# Patient Record
Sex: Male | Born: 1955 | Race: Black or African American | Hispanic: No | Smoking: Current every day smoker
Health system: Southern US, Community
[De-identification: ages and names within clinical notes are randomized; demographics above are authoritative.]

## PROBLEM LIST (undated history)

## (undated) DIAGNOSIS — G8929 Other chronic pain: Secondary | ICD-10-CM

## (undated) DIAGNOSIS — M1990 Unspecified osteoarthritis, unspecified site: Secondary | ICD-10-CM

## (undated) DIAGNOSIS — Z86718 Personal history of other venous thrombosis and embolism: Secondary | ICD-10-CM

## (undated) DIAGNOSIS — M25519 Pain in unspecified shoulder: Secondary | ICD-10-CM

## (undated) DIAGNOSIS — D649 Anemia, unspecified: Secondary | ICD-10-CM

## (undated) DIAGNOSIS — M25559 Pain in unspecified hip: Secondary | ICD-10-CM

## (undated) DIAGNOSIS — I82409 Acute embolism and thrombosis of unspecified deep veins of unspecified lower extremity: Secondary | ICD-10-CM

## (undated) DIAGNOSIS — M5010 Cervical disc disorder with radiculopathy, unspecified cervical region: Secondary | ICD-10-CM

## (undated) DIAGNOSIS — I10 Essential (primary) hypertension: Secondary | ICD-10-CM

## (undated) DIAGNOSIS — B182 Chronic viral hepatitis C: Secondary | ICD-10-CM

## (undated) HISTORY — PX: NECK SURGERY: SHX720

## (undated) HISTORY — PX: HIP SURGERY: SHX245

---

## 2006-01-10 ENCOUNTER — Emergency Department (HOSPITAL_COMMUNITY): Admission: EM | Admit: 2006-01-10 | Discharge: 2006-01-10 | Payer: Self-pay | Admitting: Emergency Medicine

## 2007-11-04 ENCOUNTER — Emergency Department (HOSPITAL_COMMUNITY): Admission: EM | Admit: 2007-11-04 | Discharge: 2007-11-04 | Payer: Self-pay | Admitting: Emergency Medicine

## 2008-07-10 ENCOUNTER — Encounter (HOSPITAL_COMMUNITY): Admission: RE | Admit: 2008-07-10 | Discharge: 2008-08-03 | Payer: Self-pay

## 2008-10-29 ENCOUNTER — Ambulatory Visit (HOSPITAL_COMMUNITY): Admission: RE | Admit: 2008-10-29 | Discharge: 2008-10-29 | Payer: Self-pay | Admitting: Hematology and Oncology

## 2009-08-06 ENCOUNTER — Emergency Department (HOSPITAL_COMMUNITY): Admission: EM | Admit: 2009-08-06 | Discharge: 2009-08-06 | Payer: Self-pay | Admitting: Emergency Medicine

## 2010-07-07 ENCOUNTER — Emergency Department (HOSPITAL_COMMUNITY)
Admission: EM | Admit: 2010-07-07 | Discharge: 2010-07-07 | Disposition: A | Discharge status: Home / Self Care | Payer: Medicare Other | Attending: Emergency Medicine | Admitting: Emergency Medicine

## 2010-07-07 ENCOUNTER — Emergency Department (HOSPITAL_COMMUNITY): Payer: Medicare Other

## 2010-07-07 DIAGNOSIS — M129 Arthropathy, unspecified: Secondary | ICD-10-CM | POA: Insufficient documentation

## 2010-07-07 DIAGNOSIS — L02419 Cutaneous abscess of limb, unspecified: Secondary | ICD-10-CM | POA: Insufficient documentation

## 2010-07-07 DIAGNOSIS — IMO0002 Reserved for concepts with insufficient information to code with codable children: Secondary | ICD-10-CM | POA: Insufficient documentation

## 2010-07-07 DIAGNOSIS — I10 Essential (primary) hypertension: Secondary | ICD-10-CM | POA: Insufficient documentation

## 2010-07-07 LAB — CBC
MCH: 34.4 pg — ABNORMAL HIGH (ref 26.0–34.0)
MCHC: 34.6 g/dL (ref 30.0–36.0)
MCV: 99.6 fL (ref 78.0–100.0)
Platelets: 217 10*3/uL (ref 150–400)
RDW: 12.2 % (ref 11.5–15.5)

## 2010-07-07 LAB — BASIC METABOLIC PANEL
CO2: 29 mEq/L (ref 19–32)
Calcium: 9.6 mg/dL (ref 8.4–10.5)
Creatinine, Ser: 0.9 mg/dL (ref 0.4–1.5)
GFR calc non Af Amer: >60 mL/min (ref >60)

## 2010-07-07 LAB — DIFFERENTIAL
Basophils Relative: 0 % (ref 0–1)
Eosinophils Absolute: 0.3 10*3/uL (ref 0.0–0.7)
Eosinophils Relative: 4 % (ref 0–5)
Lymphs Abs: 2.4 10*3/uL (ref 0.7–4.0)
Monocytes Absolute: 1 10*3/uL (ref 0.1–1.0)
Monocytes Relative: 13 % — ABNORMAL HIGH (ref 3–12)

## 2011-01-22 LAB — DIFFERENTIAL
Basophils Absolute: 0
Basophils Relative: 0
Monocytes Relative: 11
Neutro Abs: 8.4 — ABNORMAL HIGH
Neutrophils Relative %: 73

## 2011-01-22 LAB — BASIC METABOLIC PANEL
Calcium: 9.4
GFR calc non Af Amer: >60
Glucose, Bld: 101 — ABNORMAL HIGH
Sodium: 137

## 2011-01-22 LAB — POCT CARDIAC MARKERS
Myoglobin, poc: 67.5
Myoglobin, poc: 89.4
Operator id: 106841
Operator id: 208401
Troponin i, poc: 0.07 — ABNORMAL HIGH

## 2011-01-22 LAB — CK TOTAL AND CKMB (NOT AT ARMC): Total CK: 146

## 2011-01-22 LAB — CBC
MCHC: 33.8
RBC: 4.76

## 2012-03-21 ENCOUNTER — Ambulatory Visit (HOSPITAL_COMMUNITY)
Admission: RE | Admit: 2012-03-21 | Discharge: 2012-03-21 | Disposition: A | Discharge status: Home / Self Care | Payer: Non-veteran care | Source: Ambulatory Visit | Attending: Family Medicine | Admitting: Family Medicine

## 2012-03-21 DIAGNOSIS — IMO0001 Reserved for inherently not codable concepts without codable children: Secondary | ICD-10-CM | POA: Insufficient documentation

## 2012-03-21 DIAGNOSIS — M6281 Muscle weakness (generalized): Secondary | ICD-10-CM | POA: Insufficient documentation

## 2012-03-21 DIAGNOSIS — M25519 Pain in unspecified shoulder: Secondary | ICD-10-CM | POA: Insufficient documentation

## 2012-03-21 NOTE — Evaluation (Addendum)
Occupational Therapy Evaluation  Patient Details  Name: Jimmy Holland MRN: [id number] Date of Birth: Mar 10, 1956  Today's Date: 03/21/2012 Time: 4098-1191 OT Time Calculation (min): 30 min OT Evaluation 1015-1030 15' Manual Therapy 1030-1045 15'  Visit#: 1  of 8   Re-eval: 04/18/12  Assessment Diagnosis: Bilateral Shoulder Weakness  Authorization: [facility] has authorized 8 visits  Authorization Time Period:    Authorization Visit#: 1  of 8    Past Medical History: No past medical history on file. Past Surgical History: No past surgical history on file.  Subjective S:  can't hardly get my clothes on or shave. Pertinent History: Jimmy Holland has been having increased pain and loss of mobility in his shoulders, with the left being worse than the right, for the past 2 months.  He consulted with his MD at the [place], and has been referred to occupational therapy for evalution and treatment.  He has a C4-C6 fusion several years ago and has had difficulties with LUE use in the past.  He had an xray completed at the [place] which was nonconclusive, according to the patient.  He has been referred to occupational therapy for evaluation and treatment. Special Tests: UEFI scored 31% I level Patient Stated Goals: I want to get my arms working again.   Pain Assessment Currently in Pain?: Yes Pain Score:   5 Pain Location: Shoulder Pain Orientation: Right;Left Pain Type: Acute pain  Precautions/Restrictions   progress as tolerated  Prior Functioning  Home Living Lives With: Family Prior Function Level of Independence: Independent with basic ADLs;Independent with homemaking with ambulation Driving: Yes Vocation: Retired Leisure: Hobbies-yes (Comment) Comments: Enjoys yard work, basketball, Curator work  Assessment ADL/Vision/Perception ADL ADL Comments: Jimmy Holland is having difficulty donning and doffing shirts, reaching into cabinets for cans of food, shaving, taking a bath  Dominant Hand:  Right  Cognition/Observation Cognition Overall Cognitive Status: Appears within functional limits for tasks assessed  Sensation/Coordination/Edema Sensation Light Touch: Appears Intact Coordination Gross Motor Movements are Fluid and Coordinated: Yes Fine Motor Movements are Fluid and Coordinated: Yes  Additional Assessments RUE AROM (degrees) RUE Overall AROM Comments: Assessed in seated Right Shoulder Flexion: 126 Degrees Right Shoulder ABduction: 120 Degrees Right Shoulder Internal Rotation: 95 Degrees Right Shoulder External Rotation: 29 Degrees RUE PROM (degrees) RUE Overall PROM Comments: WFL in supine RUE Strength Right Shoulder Flexion:  (4-/5) Right Shoulder ABduction:  (4-/5) Right Shoulder Internal Rotation:  (4-/5) Right Shoulder External Rotation: 3+/5 Grip (lbs): 47 LUE AROM (degrees) LUE Overall AROM Comments: assessed in seated ER/IR with shoulder adducted Left Shoulder Flexion: 40 Degrees Left Shoulder ABduction: 38 Degrees Left Shoulder Internal Rotation: 90 Degrees Left Shoulder External Rotation: 30 Degrees LUE PROM (degrees) LUE Overall PROM Comments: WFL in supine LUE Strength Left Shoulder Flexion: 3-/5 Left Shoulder ABduction: 3-/5 Left Shoulder Internal Rotation: 4/5 Left Shoulder External Rotation: 3-/5 Grip (lbs): 25 Palpation Palpation: Left shoulder with moderate fascial restrictions in scapular region     Exercise/Treatments    Manual Therapy Manual Therapy: Myofascial release Myofascial Release: MFR and manual stretching to bilateral scapular, upper arm, and shoulder region to decrease pain and fascial restrictions and increase pain free mobility.  [id number]  Occupational Therapy Assessment and Plan OT Assessment and Plan Clinical Impression Statement: A:  Jimmy Holland is a 56 year old male with bilateral shoulder weakness, grip strength, and decreased mobiity, with increased pain and fascial restrictions causing decreased functional I  with B/IADLs, leisure activities.  Pt will benefit from skilled therapeutic  intervention in order to improve on the following deficits: Decreased range of motion;Decreased strength;Increased muscle spasms;Increased fascial restricitons;Impaired UE functional use;Pain Rehab Potential: Good OT Frequency: Min 2X/week OT Duration: 4 weeks OT Treatment/Interventions: Self-care/ADL training;Therapeutic exercise;Therapeutic activities;Manual therapy;Patient/family education OT Plan: P:  Skilled OT intervention to decrease pain and restrictions and increase pain free mobility, strength, grip strength, and functional use of BUE to [facility].  Treatment Plan:  MFR and manual stretching to bilateral shoulders, supine PROM and AAROM, isometric stretgthening seated elev, ext, ret, row, ball stretches, pulleys, grip strengthening with yellow tputty and sponge activity.   Goals Short Term Goals Time to Complete Short Term Goals: 2 weeks Short Term Goal 1: Patient will be educated on HEP.  Short Term Goal 2: Patient will increase bilateral shoulder AROM by 10 degrees in order to don and doff shirts with greater independence. Short Term Goal 3: Patient will increase bilateral shoulder strength to 4/5 for increased ability to lift canned goods into cabinets. Short Term Goal 4: Patient will increase bilateral grip strength by 5 pounds for increased I with opening jars when cooking. Short Term Goal 5: Patient will decrease pain in bilateral shoulders to 3/10 when completing functional activities. Additional Short Term Goals?: Yes Short Term Goal 6: Patient will increase scapular stability to fair +. Short Term Goal 7: Patient will decrease fascial restrictions to minimal in his shoulders. Long Term Goals Time to Complete Long Term Goals: 4 weeks Long Term Goal 1: Patient will increase I with B/IADLs and leisure activities to prior level of I. Long Term Goal 2: Patient will increase bilateral shoulder AROM by 30 degrees  in order to don and doff shirts with greater independence. Long Term Goal 3: Patient will increase bilateral shoulder strength to 4+/5 for increased ability to lift canned goods into cabinets. Long Term Goal 4: Patient will increase bilateral grip strength by 15 pounds for increased I with opening jars when cooking. Long Term Goal 5: Patient will decrease pain in bilateral shoulders to 1/10 when completing functional activities. Additional Long Term Goals?: Yes Long Term Goal 6: Patient will increase scapular stability to good -. Long Term Goal 7: Patient will decrease fascial restrictions to minimal in his shoulders.  Problem List Patient Active Problem List  Diagnosis  . Pain in joint, shoulder region  . Muscle weakness (generalized)    End of Session Activity Tolerance: Patient tolerated treatment well General Behavior During Session: [facility] for tasks performed Cognition: [facility] for tasks performed OT Plan of Care OT Home Exercise Plan: Educated on grip strengthening with theraputty and dowel rod exercises in supine.  Shirlean Mylar, OTR/L  03/21/2012, 3:17 PM  Physician Documentation Your signature is required to indicate approval of the treatment plan as stated above.  Please sign and either send electronically or make a copy of this report for your files and return this physician signed original.  Please mark one 1.__approve of plan  2. ___approve of plan with the following conditions.   ______________________________                                                          _____________________ Physician Signature  Date Gcodes: Moving handling items Current [id number] ck Goal      [phone] ci

## 2012-03-28 ENCOUNTER — Ambulatory Visit (HOSPITAL_COMMUNITY)
Admission: RE | Admit: 2012-03-28 | Discharge: 2012-03-28 | Disposition: A | Discharge status: Home / Self Care | Payer: Non-veteran care | Source: Ambulatory Visit | Attending: Family Medicine | Admitting: Family Medicine

## 2012-03-28 DIAGNOSIS — M25519 Pain in unspecified shoulder: Secondary | ICD-10-CM | POA: Insufficient documentation

## 2012-03-28 DIAGNOSIS — M6281 Muscle weakness (generalized): Secondary | ICD-10-CM | POA: Insufficient documentation

## 2012-03-28 DIAGNOSIS — IMO0001 Reserved for inherently not codable concepts without codable children: Secondary | ICD-10-CM | POA: Insufficient documentation

## 2012-03-28 NOTE — Progress Notes (Signed)
Occupational Therapy Treatment Patient Details  Name: Jimmy Holland MRN: [id number] Date of Birth: 1956/01/09  Today's Date: 03/28/2012 Time: 4098-1191 OT Time Calculation (min): 40 min Manual Therapy [phone] 25' Therapeutic Exercise 1002-1016 14'  Visit#: 2  of 8   Re-eval: 04/18/12 Assessment Diagnosis: Bilateral Shoulder Weakness  Authorization: [facility] has authorized 8 visits   Authorization Time Period:    Authorization Visit#: 2  of 8   Subjective Symptoms/Limitations Symptoms: S:  My arms are stiff this morning. Pain Assessment Currently in Pain?: No/denies  Precautions/Restrictions     Exercise/Treatments Supine Protraction: PROM;10 reps;AAROM;5 reps Protraction Limitations: assist needed to initiate and eccentric contraction to prevent pain. Horizontal ABduction: PROM;10 reps;AAROM;5 reps External Rotation: PROM;10 reps;AAROM;5 reps Internal Rotation: PROM;10 reps;AAROM;5 reps Flexion: PROM;10 reps;AAROM;5 reps Flexion Limitations: assist needed to initiate and eccentric contraction to prevent pain. ABduction: PROM;10 reps;AROM;5 reps Seated Elevation: AROM;10 reps Extension: AROM;10 reps Row: AROM;10 reps Therapy Ball Flexion: 15 reps ABduction: 15 reps;Other (comment) (left then right) ROM / Strengthening / Isometric Strengthening   Flexion: 5X5" Extension: 5X5" External Rotation: 5X5" Internal Rotation: 5X5" ABduction: 5X5" ADduction: 5X5"     Manual Therapy Manual Therapy: Myofascial release Myofascial Release: MFR and manual stretching to bilateral scapular, upper arm, and shoulder region to decrease pain and fascial restrictions and increase pain free mobility  Occupational Therapy Assessment and Plan OT Assessment and Plan Clinical Impression Statement: A:  Added multiple new exercises which patient tolerated well, isometrics, AAROM with dowel and assist from therapist, ball stretches and scapular AROM.  Assist needed  with AAROM to initiate and  for eccentric contraction to prevent pain. OT Plan: P:  Add pulleys and grip strengthening to increase bilateral shoulder AROM and grip strength.   Goals Short Term Goals Short Term Goal 1: Patient will be educated on HEP.  Short Term Goal 2: Patient will increase bilateral shoulder AROM by 10 degrees in order to don and doff shirts with greater independence. Short Term Goal 3: Patient will increase bilateral shoulder strength to 4/5 for increased ability to lift canned goods into cabinets. Short Term Goal 4: Patient will increase bilateral grip strength by 5 pounds for increased I with opening jars when cooking. Short Term Goal 5: Patient will decrease pain in bilateral shoulders to 3/10 when completing functional activities. Additional Short Term Goals?: Yes Short Term Goal 6: Patient will increase scapular stability to fair +. Short Term Goal 7: Patient will decrease fascial restrictions to minimal in his shoulders. Long Term Goals Time to Complete Long Term Goals: 4 weeks Long Term Goal 1: Patient will increase I with B/IADLs and leisure activities to prior level of I. Long Term Goal 2: Patient will increase bilateral shoulder AROM by 30 degrees in order to don and doff shirts with greater independence. Long Term Goal 3: Patient will increase bilateral shoulder strength to 4+/5 for increased ability to lift canned goods into cabinets. Long Term Goal 4: Patient will increase bilateral grip strength by 15 pounds for increased I with opening jars when cooking. Long Term Goal 5: Patient will decrease pain in bilateral shoulders to 1/10 when completing functional activities. Additional Long Term Goals?: Yes Long Term Goal 6: Patient will increase scapular stability to good -. Long Term Goal 7: Patient will decrease fascial restrictions to minimal in his shoulders.  Problem List Patient Active Problem List  Diagnosis  . Pain in joint, shoulder region  . Muscle weakness (generalized)     End of Session Activity Tolerance:  Patient tolerated treatment well General Behavior During Session: [facility] for tasks performed Cognition: [facility] for tasks performed  GO   Gervase Colberg L. Xzavien Harada, COTA/L  03/28/2012, 10:22 AM

## 2012-03-30 ENCOUNTER — Inpatient Hospital Stay (HOSPITAL_COMMUNITY): Admission: RE | Admit: 2012-03-30 | Payer: Non-veteran care | Source: Ambulatory Visit | Admitting: Occupational Therapy

## 2012-04-04 ENCOUNTER — Ambulatory Visit (HOSPITAL_COMMUNITY): Payer: Non-veteran care | Admitting: Specialist

## 2012-04-06 ENCOUNTER — Ambulatory Visit (HOSPITAL_COMMUNITY): Payer: Non-veteran care | Admitting: Occupational Therapy

## 2012-04-11 ENCOUNTER — Ambulatory Visit (HOSPITAL_COMMUNITY)
Admission: RE | Admit: 2012-04-11 | Discharge: 2012-04-11 | Disposition: A | Discharge status: Home / Self Care | Payer: Non-veteran care | Source: Ambulatory Visit | Attending: Family Medicine | Admitting: Family Medicine

## 2012-04-11 DIAGNOSIS — M25519 Pain in unspecified shoulder: Secondary | ICD-10-CM

## 2012-04-11 DIAGNOSIS — M6281 Muscle weakness (generalized): Secondary | ICD-10-CM

## 2012-04-11 NOTE — Progress Notes (Signed)
Occupational Therapy Treatment Patient Details  Name: Jimmy Holland MRN: [id number] Date of Birth: [birth year]  Today's Date: 04/11/2012 Time: 3086-5784 OT Time Calculation (min): 49 min Manual Therapy [id number] 22' Therapeutic Exercises [phone] 27' Visit#: 3  of 8   Re-eval: 04/18/12    Authorization: [facility] has authorized 8 visits   Authorization Time Period:    Authorization Visit#: 3  of 8   Subjective S:  I went to the [place].  I go back in January.  They think I am going to need shoulder surgery, it may be coming from my neck.  Pain Assessment Currently in Pain?: Yes Pain Score:   8 Pain Location: Shoulder Pain Orientation: Right;Left Pain Type: Chronic pain  Precautions/Restrictions   progress as tolerated  Exercise/Treatments Supine Protraction: PROM;5 reps;AAROM;10 reps;Limitations Protraction Limitations: assist needed to initiate and eccentric contraction to prevent pain. Horizontal ABduction: PROM;5 reps;AAROM;10 reps;Limitations Horizontal ABduction Limitations: with assist from OTR/L External Rotation: PROM;5 reps;AAROM;10 reps;Limitations (with assist from OTR/L) External Rotation Limitations: with assist from OTR/L Internal Rotation: PROM;5 reps;AAROM;Limitations Internal Rotation Limitations: with assist from OTR/L Flexion: PROM;5 reps;AAROM;10 reps;Limitations Flexion Limitations: with assist from OTR/L  ABduction: PROM;10 reps Seated Elevation: AROM;15 reps Extension: AROM;15 reps Row: AROM;15 reps Therapy Ball Flexion: 25 reps ABduction: 25 reps (to right and left) ROM / Strengthening / Isometric Strengthening   Flexion: 5X5" (seated) Extension: 5X5" (seated) External Rotation: 5X5" (seated) Internal Rotation: 5X5" (seated) ABduction: 5X5" (seated) ADduction: 5X5" (seated)    Manual Therapy Manual Therapy: Myofascial release Myofascial Release: MFR and manual stretching to bilateral scapular, upper arm, and shoulder region to decrease pain and  fascial restrictions and increase pain free mobility   Occupational Therapy Assessment and Plan OT Assessment and Plan Clinical Impression Statement: A:  Requires assistance with dowel rod exercises in supine due to limited mobility.  Transitioned to isometrics in seated.  Patient has good results with these exercises so issued as HEP, in addition to towel slides, dc dowel rod exercises from HEP at this time.  Pain level decreased from 8/10 to 7/10 with MFR.   OT Plan: P:  Add pulleys and grip strengthening exercises, followup on HEP.   Goals Short Term Goals Short Term Goal 1: Patient will be educated on HEP.  Short Term Goal 1 Progress: Progressing toward goal Short Term Goal 2: Patient will increase bilateral shoulder AROM by 10 degrees in order to don and doff shirts with greater independence. Short Term Goal 2 Progress: Progressing toward goal Short Term Goal 3: Patient will increase bilateral shoulder strength to 4/5 for increased ability to lift canned goods into cabinets. Short Term Goal 3 Progress: Progressing toward goal Short Term Goal 4: Patient will increase bilateral grip strength by 5 pounds for increased I with opening jars when cooking. Short Term Goal 4 Progress: Progressing toward goal Short Term Goal 5: Patient will decrease pain in bilateral shoulders to 3/10 when completing functional activities. Short Term Goal 5 Progress: Progressing toward goal Additional Short Term Goals?: Yes Short Term Goal 6: Patient will increase scapular stability to fair +. Short Term Goal 6 Progress: Progressing toward goal Short Term Goal 7: Patient will decrease fascial restrictions to minimal in his shoulders. Short Term Goal 7 Progress: Progressing toward goal Long Term Goals Time to Complete Long Term Goals: 4 weeks Long Term Goal 1: Patient will increase I with B/IADLs and leisure activities to prior level of I. Long Term Goal 1 Progress: Progressing toward goal Long Term Goal 2:  Patient will increase bilateral shoulder AROM by 30 degrees in order to don and doff shirts with greater independence. Long Term Goal 2 Progress: Progressing toward goal Long Term Goal 3: Patient will increase bilateral shoulder strength to 4+/5 for increased ability to lift canned goods into cabinets. Long Term Goal 3 Progress: Progressing toward goal Long Term Goal 4: Patient will increase bilateral grip strength by 15 pounds for increased I with opening jars when cooking. Long Term Goal 4 Progress: Progressing toward goal Long Term Goal 5: Patient will decrease pain in bilateral shoulders to 1/10 when completing functional activities. Long Term Goal 5 Progress: Progressing toward goal Additional Long Term Goals?: Yes Long Term Goal 6: Patient will increase scapular stability to good -. Long Term Goal 6 Progress: Progressing toward goal Long Term Goal 7: Patient will decrease fascial restrictions to minimal in his shoulders. Long Term Goal 7 Progress: Progressing toward goal  Problem List Patient Active Problem List  Diagnosis  . Pain in joint, shoulder region  . Muscle weakness (generalized)    End of Session Activity Tolerance: Patient tolerated treatment well General Behavior During Session: [facility] for tasks performed Cognition: [facility] for tasks performed OT Plan of Care OT Home Exercise Plan: dc dowel rod exercises, added towel slides and isometrics.   Shirlean Mylar, OTR/L  04/11/2012, 11:20 AM

## 2012-04-13 ENCOUNTER — Ambulatory Visit (HOSPITAL_COMMUNITY)
Admission: RE | Admit: 2012-04-13 | Discharge: 2012-04-13 | Disposition: A | Discharge status: Home / Self Care | Payer: Non-veteran care | Source: Ambulatory Visit | Attending: Family Medicine | Admitting: Family Medicine

## 2012-04-13 DIAGNOSIS — M25519 Pain in unspecified shoulder: Secondary | ICD-10-CM

## 2012-04-13 DIAGNOSIS — M6281 Muscle weakness (generalized): Secondary | ICD-10-CM

## 2012-04-13 NOTE — Progress Notes (Signed)
Occupational Therapy Treatment Patient Details  Name: Jimmy Holland MRN: [id number] Date of Birth: [birth year]  Today's Date: 04/13/2012 Time: 1020-1110 OT Time Calculation (min): 50 min Manual Therapy 1020-1053 33' Therapeutic Exercise 1054-1110 16'  Visit#: 4  of 8   Re-eval: 04/18/12    Authorization: [facility] has authorized 8 visits   Authorization Time Period:    Authorization Visit#: 4  of 8   Subjective Symptoms/Limitations Symptoms: S:  I am aching a bit today, mostly around my neck. Pain Assessment Currently in Pain?: Yes Pain Score:   8 Pain Location: Shoulder (shoulder into neck) Pain Orientation: Right;Left Pain Type: Acute pain  Precautions/Restrictions     Exercise/Treatments Supine Protraction: PROM;5 reps;AAROM;10 reps;Limitations Horizontal ABduction: PROM;5 reps;AAROM;10 reps;Limitations External Rotation: PROM;5 reps;AAROM;10 reps;Limitations Internal Rotation: PROM;5 reps;AAROM;Limitations Flexion: PROM;5 reps;AAROM;10 reps;Limitations ABduction: PROM;10 reps Seated Elevation: AROM;15 reps Extension: AROM;15 reps Row: AROM;15 reps Pulleys Flexion: 2 minutes ABduction: 2 minutes Therapy Ball Flexion: 25 reps ABduction: 25 reps (to right and left) ROM / Strengthening / Isometric Strengthening   Flexion: 5X5" (seated) Extension: 5X5" (seated) External Rotation: 5X5" (seated) Internal Rotation: 5X5" (seated) ABduction: 5X5" (seated) ADduction: 5X5" (seated)       Manual Therapy Manual Therapy: Myofascial release Myofascial Release: MFR and manual stretching to bilateral scapular, upper arm, and shoulder region to decrease pain and fascial restrictions and increase pain free mobility  Occupational Therapy Assessment and Plan OT Assessment and Plan Clinical Impression Statement: A:  Added pulleys today which patient completed well.  Decreased assist needed for eccentric contraction with AAROM. OT Plan: P:  Add grip strengthening to address  hand weakness.   Goals Short Term Goals Short Term Goal 1: Patient will be educated on HEP.  Short Term Goal 2: Patient will increase bilateral shoulder AROM by 10 degrees in order to don and doff shirts with greater independence. Short Term Goal 3: Patient will increase bilateral shoulder strength to 4/5 for increased ability to lift canned goods into cabinets. Short Term Goal 4: Patient will increase bilateral grip strength by 5 pounds for increased I with opening jars when cooking. Short Term Goal 5: Patient will decrease pain in bilateral shoulders to 3/10 when completing functional activities. Additional Short Term Goals?: Yes Short Term Goal 6: Patient will increase scapular stability to fair +. Short Term Goal 7: Patient will decrease fascial restrictions to minimal in his shoulders. Long Term Goals Time to Complete Long Term Goals: 4 weeks Long Term Goal 1: Patient will increase I with B/IADLs and leisure activities to prior level of I. Long Term Goal 2: Patient will increase bilateral shoulder AROM by 30 degrees in order to don and doff shirts with greater independence. Long Term Goal 3: Patient will increase bilateral shoulder strength to 4+/5 for increased ability to lift canned goods into cabinets. Long Term Goal 4: Patient will increase bilateral grip strength by 15 pounds for increased I with opening jars when cooking. Long Term Goal 5: Patient will decrease pain in bilateral shoulders to 1/10 when completing functional activities. Additional Long Term Goals?: Yes Long Term Goal 6: Patient will increase scapular stability to good -. Long Term Goal 7: Patient will decrease fascial restrictions to minimal in his shoulders.  Problem List Patient Active Problem List  Diagnosis  . Pain in joint, shoulder region  . Muscle weakness (generalized)    End of Session Activity Tolerance: Patient tolerated treatment well General Behavior During Session: [facility] for tasks  performed Cognition: [facility] for tasks performed  GO    [name], Iyonnah Ferrante L 04/13/2012, 2:24 PM

## 2012-04-18 ENCOUNTER — Ambulatory Visit (HOSPITAL_COMMUNITY)
Admission: RE | Admit: 2012-04-18 | Discharge: 2012-04-18 | Disposition: A | Discharge status: Home / Self Care | Payer: Non-veteran care | Source: Ambulatory Visit | Attending: Family Medicine | Admitting: Family Medicine

## 2012-04-18 DIAGNOSIS — M6281 Muscle weakness (generalized): Secondary | ICD-10-CM

## 2012-04-18 DIAGNOSIS — M25519 Pain in unspecified shoulder: Secondary | ICD-10-CM

## 2012-04-18 NOTE — Progress Notes (Signed)
Occupational Therapy Treatment Patient Details  Name: Jimmy Holland MRN: [id number] Date of Birth: 1955-05-18  Today's Date: 04/18/2012 Time: 4098-1191 OT Time Calculation (min): 44 min Manual Therapy 1015-1046 31 Therapeutic Exercise 4782-9562 12'  Visit#: 5  of 8   Re-eval: 05/16/12 Assessment Diagnosis: Bilateral Shoulder Weakness  Authorization: [facility] has authorized [facility] visits  Authorization Time Period:    Authorization Visit#: 5  of 8   Subjective Symptoms/Limitations Symptoms: S:  I'm not hurting like I was.  Mostly just stiff. Special Tests: UEFI scored 31% I level at initial evaluation and is currently 46% I level.   Pain Assessment Currently in Pain?: Yes Pain Score:   4 Pain Orientation: Right;Left Pain Type: Acute pain  Precautions/Restrictions     Exercise/Treatments Supine Protraction: PROM;5 reps Horizontal ABduction: PROM;5 reps External Rotation: PROM;5 reps Internal Rotation: PROM;5 reps Flexion: PROM;5 reps ABduction: PROM;5 reps Seated Elevation: AROM;15 reps Extension: AROM;15 reps Row: AROM;15 reps Pulleys Flexion: Other (comment) (time secondary to reeval) ABduction: Other (comment) (time secondary to reeval) Therapy Ball Flexion: 25 reps ABduction: 25 reps ROM / Strengthening / Isometric Strengthening   Flexion: Other (comment) (time secondary to reeval) Extension: Other (comment) External Rotation: Other (comment) Internal Rotation: Other (comment) ABduction: Other (comment) ADduction: Other (comment)      Manual Therapy Manual Therapy: Myofascial release Myofascial Release: MFR and manual stretching to bilateral scapular, upper arm, and shoulder region to decrease pain and fascial restrictions and increase pain free mobility   Occupational Therapy Assessment and Plan OT Assessment and Plan Clinical Impression Statement: A:  See progress note.  Some exercises missed secondary to reeval. OT Plan: P:  Resume missed exercises and add  grip strengthening, if joints not inflamed, to address hand weakness.   Goals Short Term Goals Short Term Goal 1: Patient will be educated on HEP.  Short Term Goal 1 Progress: Progressing toward goal Short Term Goal 2: Patient will increase bilateral shoulder AROM by 10 degrees in order to don and doff shirts with greater independence. Short Term Goal 2 Progress: Progressing toward goal Short Term Goal 3: Patient will increase bilateral shoulder strength to 4/5 for increased ability to lift canned goods into cabinets. Short Term Goal 3 Progress: Progressing toward goal Short Term Goal 4: Patient will increase bilateral grip strength by 5 pounds for increased I with opening jars when cooking. Short Term Goal 4 Progress: Progressing toward goal Short Term Goal 5: Patient will decrease pain in bilateral shoulders to 3/10 when completing functional activities. Short Term Goal 5 Progress: Progressing toward goal Additional Short Term Goals?: Yes Short Term Goal 6: Patient will increase scapular stability to fair +. Short Term Goal 6 Progress: Progressing toward goal Short Term Goal 7: Patient will decrease fascial restrictions to minimal in his shoulders. Short Term Goal 7 Progress: Progressing toward goal Long Term Goals Time to Complete Long Term Goals: 4 weeks Long Term Goal 1: Patient will increase I with B/IADLs and leisure activities to prior level of I. Long Term Goal 1 Progress: Progressing toward goal Long Term Goal 2: Patient will increase bilateral shoulder AROM by 30 degrees in order to don and doff shirts with greater independence. Long Term Goal 2 Progress: Progressing toward goal Long Term Goal 3: Patient will increase bilateral shoulder strength to 4+/5 for increased ability to lift canned goods into cabinets. Long Term Goal 3 Progress: Progressing toward goal Long Term Goal 4: Patient will increase bilateral grip strength by 15 pounds for increased I with  opening jars when  cooking. Long Term Goal 4 Progress: Progressing toward goal Long Term Goal 5: Patient will decrease pain in bilateral shoulders to 1/10 when completing functional activities. Long Term Goal 5 Progress: Progressing toward goal Additional Long Term Goals?: Yes Long Term Goal 6: Patient will increase scapular stability to good -. Long Term Goal 6 Progress: Progressing toward goal Long Term Goal 7: Patient will decrease fascial restrictions to minimal in his shoulders. Long Term Goal 7 Progress: Progressing toward goal  Problem List Patient Active Problem List  Diagnosis  . Pain in joint, shoulder region  . Muscle weakness (generalized)    End of Session Activity Tolerance: Patient tolerated treatment well General Behavior During Session: [facility] for tasks performed Cognition: [facility] for tasks performed  GO    [name], Zaliah Wissner L 04/18/2012, 1:47 PM

## 2012-04-21 ENCOUNTER — Ambulatory Visit (HOSPITAL_COMMUNITY)
Admission: RE | Admit: 2012-04-21 | Payer: Non-veteran care | Source: Ambulatory Visit | Attending: Family Medicine | Admitting: Family Medicine

## 2012-04-25 ENCOUNTER — Ambulatory Visit (HOSPITAL_COMMUNITY)
Admission: RE | Admit: 2012-04-25 | Discharge: 2012-04-25 | Disposition: A | Discharge status: Home / Self Care | Payer: Non-veteran care | Source: Ambulatory Visit | Attending: Family Medicine | Admitting: Family Medicine

## 2012-04-25 DIAGNOSIS — M6281 Muscle weakness (generalized): Secondary | ICD-10-CM

## 2012-04-25 DIAGNOSIS — M25519 Pain in unspecified shoulder: Secondary | ICD-10-CM

## 2012-04-25 NOTE — Progress Notes (Signed)
Occupational Therapy Treatment Patient Details  Name: Jimmy Holland MRN: [id number] Date of Birth: January 22, 1956  Today's Date: 04/25/2012 Time: 8469-6295 OT Time Calculation (min): 38 min Manual Therapy 2841-3244 17' Therapeutic Exercises 1043-1106 23' Visit#: 6  of 8   Re-eval: 05/16/12    Authorization: [facility] has authorized 8 visits   Authorization Time Period:    Authorization Visit#: 6  of 8   Subjective S:  I still feel pretty stiff.  Hopefully when I go to the MD in January I get some answers.  The therapy is helping with the pain, just not the movement. Pain Assessment Currently in Pain?: Yes Pain Score:   6 Pain Location: Shoulder Pain Orientation: Right;Left Pain Type: Chronic pain  Precautions/Restrictions   progress as tolerated  Exercise/Treatments Supine Protraction: PROM;5 reps Horizontal ABduction: PROM;5 reps External Rotation: PROM;5 reps Internal Rotation: PROM;5 reps Flexion: PROM;5 reps ABduction: PROM;5 reps Seated Elevation: AROM;15 reps Extension: AROM;15 reps Row: AROM;15 reps Pulleys Flexion: 2 minutes ABduction: 2 minutes Therapy Ball Flexion: 25 reps ABduction: 25 reps ROM / Strengthening / Isometric Strengthening   Flexion: 5X5" (seated) Extension: 5X5" (seated) External Rotation: 5X5" (seated) Internal Rotation: 5X5" (seated) ABduction: 5X5" (seated) ADduction: 5X5" (seated)    Manual Therapy Manual Therapy: Myofascial release Myofascial Release: MFR and manual stretching to bilateral scapular, upper arm, and shoulder region to decrease pain and fascial restrictions and increase pain free mobility 1026-1043  Occupational Therapy Assessment and Plan OT Assessment and Plan Clinical Impression Statement: A:  Less restrictions and increased mobility in right shoulder this date, however left remains quite limited and with audible crepitis when ranging the shoulder.   OT Plan: P: Increase contraction time on isometric exercises. Decrease  pain to 4/10. Resume grip strengthening exercises.    Goals Short Term Goals Short Term Goal 1: Patient will be educated on HEP.  Short Term Goal 1 Progress: Progressing toward goal Short Term Goal 2: Patient will increase bilateral shoulder AROM by 10 degrees in order to don and doff shirts with greater independence. Short Term Goal 2 Progress: Progressing toward goal Short Term Goal 3: Patient will increase bilateral shoulder strength to 4/5 for increased ability to lift canned goods into cabinets. Short Term Goal 3 Progress: Progressing toward goal Short Term Goal 4: Patient will increase bilateral grip strength by 5 pounds for increased I with opening jars when cooking. Short Term Goal 4 Progress: Progressing toward goal Short Term Goal 5: Patient will decrease pain in bilateral shoulders to 3/10 when completing functional activities. Short Term Goal 5 Progress: Progressing toward goal Additional Short Term Goals?: Yes Short Term Goal 6: Patient will increase scapular stability to fair +. Short Term Goal 6 Progress: Progressing toward goal Short Term Goal 7: Patient will decrease fascial restrictions to minimal in his shoulders. Short Term Goal 7 Progress: Progressing toward goal Long Term Goals Time to Complete Long Term Goals: 4 weeks Long Term Goal 1: Patient will increase I with B/IADLs and leisure activities to prior level of I. Long Term Goal 1 Progress: Progressing toward goal Long Term Goal 2: Patient will increase bilateral shoulder AROM by 30 degrees in order to don and doff shirts with greater independence. Long Term Goal 2 Progress: Progressing toward goal Long Term Goal 3: Patient will increase bilateral shoulder strength to 4+/5 for increased ability to lift canned goods into cabinets. Long Term Goal 3 Progress: Progressing toward goal Long Term Goal 4: Patient will increase bilateral grip strength by 15 pounds for  increased I with opening jars when cooking. Long Term  Goal 4 Progress: Progressing toward goal Long Term Goal 5: Patient will decrease pain in bilateral shoulders to 1/10 when completing functional activities. Long Term Goal 5 Progress: Progressing toward goal Additional Long Term Goals?: Yes Long Term Goal 6: Patient will increase scapular stability to good -. Long Term Goal 6 Progress: Progressing toward goal Long Term Goal 7: Patient will decrease fascial restrictions to minimal in his shoulders. Long Term Goal 7 Progress: Progressing toward goal  Problem List Patient Active Problem List  Diagnosis  . Pain in joint, shoulder region  . Muscle weakness (generalized)    End of Session Activity Tolerance: Patient tolerated treatment well General Behavior During Session: [facility] for tasks performed Cognition: [facility] for tasks performed  GO    Shirlean Mylar, OTR/L  04/25/2012, 11:38 AM

## 2012-04-28 ENCOUNTER — Ambulatory Visit (HOSPITAL_COMMUNITY)
Admission: RE | Admit: 2012-04-28 | Discharge: 2012-04-28 | Disposition: A | Discharge status: Home / Self Care | Payer: Non-veteran care | Source: Ambulatory Visit | Attending: Family Medicine | Admitting: Family Medicine

## 2012-04-28 DIAGNOSIS — IMO0001 Reserved for inherently not codable concepts without codable children: Secondary | ICD-10-CM | POA: Insufficient documentation

## 2012-04-28 DIAGNOSIS — M25519 Pain in unspecified shoulder: Secondary | ICD-10-CM | POA: Insufficient documentation

## 2012-04-28 DIAGNOSIS — M6281 Muscle weakness (generalized): Secondary | ICD-10-CM

## 2012-04-28 NOTE — Progress Notes (Signed)
Occupational Therapy Treatment Patient Details  Name: Jimmy Holland MRN: [id number] Date of Birth: 1956-01-23  Today's Date: 04/28/2012 Time: 1012-1101 OT Time Calculation (min): 49 min Manual Therapy 4098-1191 30' Therapeutic Exercise 1043-1101 18'  Visit#: 7  of 8   Re-eval: 05/16/12 Assessment Diagnosis: Bilateral Shoulder Weakness  Authorization: [facility] has authorized 8 visits   Authorization Time Period:    Authorization Visit#: 7  of 8   Subjective Symptoms/Limitations Symptoms: S:  I am really stiff today. Pain Assessment Currently in Pain?: Yes Pain Score:   6 Pain Location: Shoulder Pain Orientation: Right;Left Pain Type: Chronic pain  Precautions/Restrictions     Exercise/Treatments Supine Protraction: PROM;5 reps Horizontal ABduction: PROM;5 reps External Rotation: PROM;5 reps Internal Rotation: PROM;5 reps Flexion: PROM;5 reps ABduction: PROM;5 reps Seated Other Seated Exercises: theraputty, flatten, roll, grip pinch with yellow Therapy Ball Flexion: 25 reps ABduction: 25 reps ROM / Strengthening / Isometric Strengthening   Flexion: 5X10" Extension: 5X10" External Rotation: 5X10" Internal Rotation: 5X10" ABduction: 5X10" ADduction: 5X10"     Manual Therapy Manual Therapy: Myofascial release Myofascial Release: MFR and manual stretching to bilateral scapular, upper arm, and shoulder region to decrease pain and fascial restrictions and increase pain free mobility  Occupational Therapy Assessment and Plan OT Assessment and Plan Clinical Impression Statement: A:  Resumed hand exercises today to address strength deficits. OT Plan: P:  Review HEP in prep for D/C   Goals Short Term Goals Short Term Goal 1: Patient will be educated on HEP.  Short Term Goal 2: Patient will increase bilateral shoulder AROM by 10 degrees in order to don and doff shirts with greater independence. Short Term Goal 3: Patient will increase bilateral shoulder strength to 4/5  for increased ability to lift canned goods into cabinets. Short Term Goal 4: Patient will increase bilateral grip strength by 5 pounds for increased I with opening jars when cooking. Short Term Goal 5: Patient will decrease pain in bilateral shoulders to 3/10 when completing functional activities. Additional Short Term Goals?: Yes Short Term Goal 6: Patient will increase scapular stability to fair +. Short Term Goal 7: Patient will decrease fascial restrictions to minimal in his shoulders. Long Term Goals Time to Complete Long Term Goals: 4 weeks Long Term Goal 1: Patient will increase I with B/IADLs and leisure activities to prior level of I. Long Term Goal 2: Patient will increase bilateral shoulder AROM by 30 degrees in order to don and doff shirts with greater independence. Long Term Goal 3: Patient will increase bilateral shoulder strength to 4+/5 for increased ability to lift canned goods into cabinets. Long Term Goal 4: Patient will increase bilateral grip strength by 15 pounds for increased I with opening jars when cooking. Long Term Goal 5: Patient will decrease pain in bilateral shoulders to 1/10 when completing functional activities. Additional Long Term Goals?: Yes Long Term Goal 6: Patient will increase scapular stability to good -. Long Term Goal 7: Patient will decrease fascial restrictions to minimal in his shoulders.  Problem List Patient Active Problem List  Diagnosis  . Pain in joint, shoulder region  . Muscle weakness (generalized)    End of Session Activity Tolerance: Patient tolerated treatment well General Behavior During Session: [facility] for tasks performed Cognition: [facility] for tasks performed  GO    [name], Mihika Surrette L 04/28/2012, 11:01 AM

## 2012-05-02 ENCOUNTER — Ambulatory Visit (HOSPITAL_COMMUNITY)
Admission: RE | Admit: 2012-05-02 | Discharge: 2012-05-02 | Disposition: A | Discharge status: Home / Self Care | Payer: Non-veteran care | Source: Ambulatory Visit | Attending: Family Medicine | Admitting: Family Medicine

## 2012-05-02 DIAGNOSIS — M25519 Pain in unspecified shoulder: Secondary | ICD-10-CM

## 2012-05-02 DIAGNOSIS — M6281 Muscle weakness (generalized): Secondary | ICD-10-CM

## 2012-05-02 NOTE — Progress Notes (Addendum)
Occupational Therapy Treatment Patient Details  Name: Jimmy Holland MRN: [id number] Date of Birth: 1956/01/10  Today's Date: 05/02/2012 Time: 1025-1100 OT Time Calculation (min): 35 min Manual Therapy 1025-1045 20' Moist Heat 1045-1100 15' Visit#: 8  of 8   Re-eval: 05/16/12    Authorization: [facility] authorized 8 visits  Authorization Time Period:    Authorization Visit#: 8  of 8   Subjective S:  Im in alot of pain today.  It takes me 40 minutes to get dressed because of my shoulders. Pain Assessment Currently in Pain?: Yes Pain Score:   8 Pain Location: Shoulder Pain Orientation: Right;Left;Upper Pain Type: Chronic pain  Precautions/Restrictions   progress as tolerated  Exercise/Treatments    Manual Therapy Manual Therapy: Myofascial release Myofascial Release: MFR and manual stretching to bilateral scapular, upper arm, trapezius, SCM,and shoulder region to decrease pain and fascial restrictions and increase pain free mobility   Bilateral arm pull 1025-1045 Moist Heat Therapy Number Minutes Moist Heat: 15 Minutes Moist Heat Location: Shoulder (bilateral)  Occupational Therapy Assessment and Plan OT Assessment and Plan Clinical Impression Statement: A:  Pain decreased from 8/10 to 6/10 after MFR intervention.  Patient has had minimal improvements with therapy.   OT Plan: P:  DC skilled OT intervention.  Patient is returning to [place] for further testing.   Goals Short Term Goals Short Term Goal 1: Patient will be educated on HEP.  Short Term Goal 1 Progress: Met Short Term Goal 2: Patient will increase bilateral shoulder AROM by 10 degrees in order to don and doff shirts with greater independence. Short Term Goal 2 Progress: Not met Short Term Goal 3: Patient will increase bilateral shoulder strength to 4/5 for increased ability to lift canned goods into cabinets. Short Term Goal 3 Progress: Not met Short Term Goal 4: Patient will increase bilateral grip strength by 5 pounds  for increased I with opening jars when cooking. Short Term Goal 4 Progress: Not met Short Term Goal 5: Patient will decrease pain in bilateral shoulders to 3/10 when completing functional activities. Short Term Goal 5 Progress: Not met Additional Short Term Goals?: Yes Short Term Goal 6: Patient will increase scapular stability to fair +. Short Term Goal 6 Progress: Not met Short Term Goal 7: Patient will decrease fascial restrictions to minimal in his shoulders. Short Term Goal 7 Progress: Not met Long Term Goals Time to Complete Long Term Goals: 4 weeks Long Term Goal 1: Patient will increase I with B/IADLs and leisure activities to prior level of I. Long Term Goal 1 Progress: Not met Long Term Goal 2: Patient will increase bilateral shoulder AROM by 30 degrees in order to don and doff shirts with greater independence. Long Term Goal 2 Progress: Not met Long Term Goal 3: Patient will increase bilateral shoulder strength to 4+/5 for increased ability to lift canned goods into cabinets. Long Term Goal 3 Progress: Not met Long Term Goal 4: Patient will increase bilateral grip strength by 15 pounds for increased I with opening jars when cooking. Long Term Goal 4 Progress: Not met Long Term Goal 5: Patient will decrease pain in bilateral shoulders to 1/10 when completing functional activities. Long Term Goal 5 Progress: Not met Additional Long Term Goals?: Yes Long Term Goal 6: Patient will increase scapular stability to good -. Long Term Goal 6 Progress: Not met Long Term Goal 7: Patient will decrease fascial restrictions to minimal in his shoulders.  Problem List Patient Active Problem List  Diagnosis  .  Pain in joint, shoulder region  . Muscle weakness (generalized)    End of Session Activity Tolerance: Patient tolerated treatment well General Behavior During Session: [facility] for tasks performed Cognition: [facility] for tasks performed  GO   Gcodes: Discharge Gcodes: Moving handling  items discharge [phone] ck Goal      [id number]  Shirlean Mylar, OTR/L  05/02/2012, 10:52 AM

## 2012-12-27 NOTE — Addendum Note (Signed)
Encounter addended by: Jacqualine Code, OTR on: 12/27/2012  2:30 PM<BR>     Documentation filed: Clinical Notes

## 2012-12-27 NOTE — Addendum Note (Signed)
Encounter addended by: Jacqualine Code, OTR on: 12/27/2012  2:32 PM<BR>     Documentation filed: Clinical Notes

## 2013-09-13 ENCOUNTER — Ambulatory Visit (HOSPITAL_COMMUNITY): Admission: RE | Admit: 2013-09-13 | Payer: Medicare Other | Source: Ambulatory Visit | Admitting: Physical Therapy

## 2013-09-19 ENCOUNTER — Other Ambulatory Visit: Payer: Self-pay | Admitting: *Deleted

## 2013-09-19 ENCOUNTER — Inpatient Hospital Stay
Admission: RE | Admit: 2013-09-19 | Discharge: 2013-09-21 | Disposition: A | Discharge status: Nursing Home (Includes ICF) | Payer: Medicare Other | Source: Ambulatory Visit | Attending: Internal Medicine | Admitting: Internal Medicine

## 2013-09-19 DIAGNOSIS — M79606 Pain in leg, unspecified: Principal | ICD-10-CM

## 2013-09-19 DIAGNOSIS — M7989 Other specified soft tissue disorders: Secondary | ICD-10-CM

## 2013-09-19 MED ORDER — OXYCODONE HCL 5 MG PO TABS: ORAL_TABLET | ORAL | Status: DC

## 2013-09-19 MED ORDER — MORPHINE SULFATE ER 30 MG PO CP24: ORAL_CAPSULE | ORAL | Status: DC

## 2013-09-19 NOTE — Telephone Encounter (Signed)
[facility] 

## 2013-09-20 ENCOUNTER — Non-Acute Institutional Stay (SKILLED_NURSING_FACILITY): Payer: Self-pay | Admitting: Internal Medicine

## 2013-09-20 ENCOUNTER — Ambulatory Visit (HOSPITAL_COMMUNITY)
Admit: 2013-09-20 | Discharge: 2013-09-20 | Disposition: A | Discharge status: Home / Self Care | Payer: Medicare Other | Attending: Internal Medicine | Admitting: Internal Medicine

## 2013-09-20 DIAGNOSIS — B1920 Unspecified viral hepatitis C without hepatic coma: Secondary | ICD-10-CM

## 2013-09-20 DIAGNOSIS — D62 Acute posthemorrhagic anemia: Secondary | ICD-10-CM

## 2013-09-20 DIAGNOSIS — M069 Rheumatoid arthritis, unspecified: Secondary | ICD-10-CM

## 2013-09-20 DIAGNOSIS — M87059 Idiopathic aseptic necrosis of unspecified femur: Secondary | ICD-10-CM

## 2013-09-20 DIAGNOSIS — M5010 Cervical disc disorder with radiculopathy, unspecified cervical region: Secondary | ICD-10-CM

## 2013-09-20 DIAGNOSIS — M87052 Idiopathic aseptic necrosis of left femur: Secondary | ICD-10-CM

## 2013-09-20 DIAGNOSIS — M5412 Radiculopathy, cervical region: Secondary | ICD-10-CM

## 2013-09-20 NOTE — Progress Notes (Signed)
Patient ID: Jimmy Holland, male   DOB: Jan 25, 1956, 58 y.o.   MRN: [id number] Facility; [facility] Chief complaint; admission to SNF will stay at the [facility] from 5/21 through 5/27 History; his is a 58 year old man who was admitted to hospital with intractable left hip pain. He underwent a left total hip arthroplasty which I think was secondary to avascular necrosis of the left hip. The patient has a history of rheumatoid arthritis which onset it in his 60s although he has never been on steroids. He also has a history of hepatitis C and was recently treated for this making him not eligible for any of the biologic agents for his rheumatoid . He appears to have tolerated the procedure well and is already up ambulating. He is on aspirin 325 twice a day presumably for DVT prophylaxis. He was also on an NSAID although this has been appropriately stopped while he is on the twice a day aspirin.   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 Independent man  Family history not currently available  Review of systems Musculoskeletal; does not complain of a lot of pain in the left leg although there is considerable swelling GI no abdominal pain had a bowel movement yesterday Gait already up walking on the hip  Physical examination Gen. patient is  not in any distress Respiratory clear entry bilaterally Cardiac heart sounds slightly tachycardic pulse rate of 120 however there is no murmurs no gallops.Marland Kitchen Appears to be euvolemic. There are no carotid bruits Abdomen; slightly distended however no liver spleen or masses are felt GU no bladder distention Extremities; surgical incision site in the left hip is well approximated Staples are still in. There is no evidence of infection here. There is considerable swelling right down the leg from the hip area with 2-3+ pitting edema. This will need to be evaluated with a duplex ultrasound to make sure that there is not a DVT. I would like to try to get this done as soon as possible. Although he appears to be on aspirin 325 twice a day for DVT prophylaxis as far as I am aware this does not provide adequate coverage although it is commonly ordered Musculoskeletal; very little range of motion in the left arm either in flexion or abduction. Appears to be some limitation of the right shoulder as well. He is deformity of the left third and fourth PIP joints. No active arthritis is seen  Impression/plan #1 status post left total hip arthroplasty. He appears to have tolerated this well #2 rule out DVT of the left leg as discussed above #3 rheumatoid arthritis; he was on NSAIDs although I am a bit reluctant to put him on both this and aspirin. She does not have any active joints #4 extreme limitation of range in the left shoulder greater than right. He says this is been  previously evaluated secondary to rotator cuff issues etc. I think this is probably at least a frozen shoulder on the left or and/or multiple tendon problems #5 recent treatment for hepatitis C; have none of these details although he is clearly not a easy candidate for any of the disease modifying drugs for his rheumatoid. He is followed by rheumatology #6 hypertension #7 cervical radiculopathy noted in his FL-2. I did not specifically evaluate him  for this. This may have something to do with his limited shoulder range. I did see him up walking with a walker he really does quite well  Addendum; I was just handed a hemoglobin of 6.6 his indices are normochromic normocytic. White count and platelet count are in the normal range differential count is normal basic metabolic panel is normal. We'll try to contact the [facility] to see what his hemoglobin normally runs. Certainly would have enough reasons to have anemia of chronic disease however he is also recently just had left hip surgery. I would like to see if I can get pre-and postop CBCs to compare, although he appears to have had a normal hemoglobin at 15.6 in 2012 [in [place]]  Duplex ultrasound was negative. We have been unable to obtain any previous labs from [facility]. Will proceed with a type and xmatch and transfusion.

## 2013-09-21 ENCOUNTER — Ambulatory Visit (HOSPITAL_COMMUNITY)
Admit: 2013-09-21 | Discharge: 2013-09-21 | Disposition: A | Discharge status: Home / Self Care | Payer: Medicare Other | Attending: Family Medicine | Admitting: Family Medicine

## 2013-09-21 DIAGNOSIS — D62 Acute posthemorrhagic anemia: Secondary | ICD-10-CM | POA: Insufficient documentation

## 2013-09-21 DIAGNOSIS — M069 Rheumatoid arthritis, unspecified: Secondary | ICD-10-CM | POA: Insufficient documentation

## 2013-09-21 DIAGNOSIS — M5010 Cervical disc disorder with radiculopathy, unspecified cervical region: Secondary | ICD-10-CM | POA: Insufficient documentation

## 2013-09-21 DIAGNOSIS — M87052 Idiopathic aseptic necrosis of left femur: Secondary | ICD-10-CM | POA: Insufficient documentation

## 2013-09-21 DIAGNOSIS — B1920 Unspecified viral hepatitis C without hepatic coma: Secondary | ICD-10-CM | POA: Insufficient documentation

## 2013-09-21 LAB — ABO/RH: ABO/RH(D): O POS

## 2013-09-21 LAB — PREPARE RBC (CROSSMATCH)

## 2013-09-22 ENCOUNTER — Encounter (HOSPITAL_COMMUNITY)
Admit: 2013-09-22 | Discharge: 2013-09-22 | Disposition: A | Discharge status: Home / Self Care | Payer: Medicare Other | Attending: Internal Medicine | Admitting: Internal Medicine

## 2013-09-22 LAB — HEMOGLOBIN AND HEMATOCRIT, BLOOD
HCT: 22.3 % — ABNORMAL LOW (ref 39.0–52.0)
Hemoglobin: 7 g/dL — ABNORMAL LOW (ref 13.0–17.0)

## 2013-09-22 MED ORDER — SODIUM CHLORIDE 0.9 % IV SOLN
INTRAVENOUS | Status: DC
  Administered 2013-09-22: 09:00:00 via INTRAVENOUS

## 2013-09-22 NOTE — Progress Notes (Signed)
Results for JOHANN, SANTONE (MRN [id number]) as of 09/22/2013 11:36  Ref. Range 09/21/2013 14:15  Hemoglobin Latest Range: 13.0-17.0 g/dL 7.0 (L)  Pre transfusion

## 2013-09-23 LAB — TYPE AND SCREEN
ABO/RH(D): O POS
ANTIBODY SCREEN: NEGATIVE
UNIT DIVISION: 0
Unit division: 0

## 2013-09-28 ENCOUNTER — Non-Acute Institutional Stay (SKILLED_NURSING_FACILITY): Payer: Self-pay | Admitting: Internal Medicine

## 2013-09-28 ENCOUNTER — Encounter: Payer: Self-pay | Admitting: Internal Medicine

## 2013-09-28 DIAGNOSIS — R599 Enlarged lymph nodes, unspecified: Secondary | ICD-10-CM

## 2013-09-28 DIAGNOSIS — D75839 Thrombocytosis, unspecified: Secondary | ICD-10-CM | POA: Insufficient documentation

## 2013-09-28 DIAGNOSIS — R591 Generalized enlarged lymph nodes: Secondary | ICD-10-CM

## 2013-09-28 DIAGNOSIS — D473 Essential (hemorrhagic) thrombocythemia: Secondary | ICD-10-CM

## 2013-09-28 DIAGNOSIS — D62 Acute posthemorrhagic anemia: Secondary | ICD-10-CM

## 2013-09-28 NOTE — Progress Notes (Signed)
Patient ID: ROHIL LESCH, male   DOB: 05/09/1955, 58 y.o.   MRN: [id number]   This is an acute visit.  Level of care skilled.  Facility [facility].  Chief complaint-acute visit followup anemia-thrombocytosis.  History of present illness.  Patient is a pleasant 58 year old male here for rehabilitation after undergoing a left hip replacement status post to avascular necrosis Apparently tolerated the procedure well.  After arrival to the facility he was discovered to be significantly anemic with a hemoglobin of 6.6 and did receive a transfusion. Lab obtained yesterday shows improvement of hemoglobin up to 9.2.  I note his platelet count is elevated at 8 21,000-it appears on initial lab on May 27 his platelet count was 353,000.  He is currently on aspirin 325 mg twice a day I suspect for DVT prophylaxis  Has a diagnosis of rheumatoid arthritis as well as positive for hepatitis C chronic.  He denies any previous history of anemia   He did have a Doppler ultrasound done of his left leg secondary to edema but this was negative for DVT-it did show mild left inguinal abnormal adenopathy-  .  Family medical social history reviewed per admission note on 09/20/2013.  Medications have been reviewed per MAR.  Review of systems.  In general no complaints of fever or chills.  Respiratory does not complain of any shortness of breath or cough.  Cardiac no chest pain edema left leg appears significantly improved.  GI no complaints of nausea vomiting diarrhea constipation or abdominal pain.  GU does not complain of dysuria.  Muscle skeletal says his joint pain hip pain is significantly improved he does receive morphine.  Neurologic does not complaining of dizziness headache or numbness.  Psych no complaints of depression or anxiety  .  Physical exam.  Temperature 97.9 pulse 93 respirations 20 blood pressure 104/66.  In general this is a pleasant middle-aged male in no distress.  His  skin is warm and dry surgical site left hip staples in place there is no sign of infection no erythema drainage or tenderness to the area.  He also has a small blister left lower leg this is followed by wound care this does not appear to be infected it is dry with no surrounding erythema.  Oropharynx clear mucous membranes moist.  Chest is clear to auscultation no labored breathing.  Heart is regular rate and rhythm without murmur gallop or continues to have right say 1+ edema left leg this is significantly improved apparently from admission Doppler ultrasound was negative  Abdomen is soft nontender with positive bowel sounds.  GU cannot appreciate any drainage bleeding or rash   The left inguinal adenopathy appears to be quite minimal.  Muscle skeletal he is ambulatory with some weakness residual left hip repair but is weight bearing appears to be doing quite well with this  .  Labs. 09/27/2013.  WBC 8.5 hemoglobin 9.2 platelets 821.  09/20/2013.  WBC 7.3 hemoglobin 6.6 platelets 353.  Sodium 137 potassium 4.1 BUN 9 creatinine 0.8.  Assessment and plan.  #1-anemia-this appears improved status post transfusion.-limited information on history of anemia although patient states he's has not had any previous transfusions--discuss with Dr. Dellia Nims via phone Will add iron twice a day and updated lab next week  #2-thrombocytosis new diagnosis;-discuss with Dr. Dellia Nims via phone-this may be reactive but will need followup with lab first laboratory data next week  #3-left inguinal adenopathy-- at this point appears minimal  will see what the labs tell us next  week--discuss with Dr. Dellia Nims via phone and will monitor for now [phone].

## 2013-10-06 ENCOUNTER — Non-Acute Institutional Stay (SKILLED_NURSING_FACILITY): Payer: Self-pay | Admitting: Internal Medicine

## 2013-10-06 ENCOUNTER — Encounter: Payer: Self-pay | Admitting: Internal Medicine

## 2013-10-06 DIAGNOSIS — M87052 Idiopathic aseptic necrosis of left femur: Secondary | ICD-10-CM

## 2013-10-06 DIAGNOSIS — D473 Essential (hemorrhagic) thrombocythemia: Secondary | ICD-10-CM

## 2013-10-06 DIAGNOSIS — M87059 Idiopathic aseptic necrosis of unspecified femur: Secondary | ICD-10-CM

## 2013-10-06 DIAGNOSIS — D62 Acute posthemorrhagic anemia: Secondary | ICD-10-CM

## 2013-10-06 DIAGNOSIS — D75839 Thrombocytosis, unspecified: Secondary | ICD-10-CM

## 2013-10-06 DIAGNOSIS — M069 Rheumatoid arthritis, unspecified: Secondary | ICD-10-CM

## 2013-10-06 NOTE — Progress Notes (Signed)
Patient ID: Jimmy Holland, male   DOB: 1956-03-13, 58 y.o.   MRN: [id number]   this is a discharge note.  Level of care skilled.  Facility [facility].    Chief complaint; Discharge note   History;this is a 58 year old man who was admitted to hospital with intractable left hip pain. He underwent a left total hip arthroplasty which I think was secondary to avascular necrosis of the left hip. The patient has a history of rheumatoid arthritis which onset it in his 52s although he has never been on steroids. He also has a history of hepatitis C and was recently treated for this making him not eligible for any of the biologic agents for his rheumatoid . He appears to have tolerated the procedure well and is now ambulatory although still with somewhat unsteady-he has made very good progress however general he is not comp any greatly increased pain-he is on morphine routinely but does not feel he really needs this he also has oxycodone when necessaryl. He is on aspirin 325 twice a day presumably for DVT prophylaxis. He was also on an NSAID although this has been appropriately stopped while he is on the twice a day aspirin.  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 Independent ma n  Family history not currently available   Review of systems Gen. no complaints of fever or chills.  Skin-does not complaining of any itching or rashes.  Head ears eyes nose mouth and throat-no complaints of visual changes sore throat  Respiratory does not complain of any shortness of breath or cough.  Cardiac no chest pain or significant lower extremity edema.     Musculoskeletal; --is not really complaining of significant pain in his left leg-he did have swelling on admission but this has improved considerably    GI no abdominal pain nausea or vomiting noted    Neurologic does not complaining of headache dizziness or numbness currently.  Psych does not complaining of depression or anxiety.   Physical examination  Temperature 97.8-pulse 80 respirations 18-blood pressure 100/74 taken manually  Gen. patient is not in any distress Skin is warm and dry.  Eyes pupils appear reactive to light visual acuity appears intact extraocular movements intact.    Respiratory clear  entry bilaterally--no labored breathing    Cardiac--heart is regular rate and rhythm without murmur gallop or rub he does not have significant lower extremity edema  Abdomen; soft nontender with positive bowel sounds    Extremities;  Is ambulatory although somewhat weak-surgical site left hip appears well healed and unremarkable  Labs.  10/02/2013.  WBC 7.4 hemoglobin 10.0 platelets 814.  09/20/2013.  WBC 7.3 hemoglobin 6.6 platelets 353.  Sodium 137 potassium 4.1 BUN 9 creatinine 0.8      Impression/plan  #1 status post left total hip arthroplasty. He appears to have tolerated this well  progress well with therapy he will need outpatient therapy --we'll discontinue the morphine since he says he does not need this he does have the oxycodone as needed  #2 anemia--he did receive a transfusion hemoglobin has stabilized and updated lab is pending he is on iron   #3 rheumatoid arthritis; he was on NSAIDs this has been discontinued at the time being this has been asymptomatic during his stay here  #4 Thrombocytosis- unclear etiology question reactive-Dr. Dellia Nims and has ordered labs for Monday which should arrive before discharge   #5 recent treatment for hepatitis C; have none of these details although he is clearly not a easy candidate for any of the disease modifying drugs for his rheumatoid. He is followed by rheumatology  #6 hypertension--he continues on low-dose clonidine as well as low-dose hydrochlorothiazide- appears to have somewhat variable systolics ranging from the 90s-131 --baseline appears to be in the 90s-low 100s-at this point will discontinue the hydrochlorothiazide and monitor blood pressures every shift until discharge notify provider systolic exceeds 622-   Of note we'll update basic metabolic panel for updated they'll use before discharge as well.  Again he will be going home apparently back with siblings-will need outpatient therapy as well as primary care provider followup which is provided by the [place].  [id number] note greater than 30 minutes spent preparing this discharge summary    ]

## 2013-10-10 ENCOUNTER — Other Ambulatory Visit: Payer: Self-pay | Admitting: *Deleted

## 2013-10-10 MED ORDER — MORPHINE SULFATE ER 30 MG PO CP24: ORAL_CAPSULE | ORAL | Status: DC

## 2013-10-10 NOTE — Telephone Encounter (Signed)
[facility] 

## 2013-10-11 ENCOUNTER — Ambulatory Visit (HOSPITAL_COMMUNITY)
Admission: RE | Admit: 2013-10-11 | Discharge: 2013-10-11 | Disposition: A | Discharge status: Home / Self Care | Payer: Non-veteran care | Source: Ambulatory Visit | Attending: Internal Medicine | Admitting: Internal Medicine

## 2013-10-11 DIAGNOSIS — IMO0001 Reserved for inherently not codable concepts without codable children: Secondary | ICD-10-CM | POA: Insufficient documentation

## 2013-10-11 DIAGNOSIS — M25659 Stiffness of unspecified hip, not elsewhere classified: Secondary | ICD-10-CM | POA: Insufficient documentation

## 2013-10-11 DIAGNOSIS — R269 Unspecified abnormalities of gait and mobility: Secondary | ICD-10-CM | POA: Insufficient documentation

## 2013-10-11 DIAGNOSIS — M25559 Pain in unspecified hip: Secondary | ICD-10-CM | POA: Insufficient documentation

## 2013-10-11 NOTE — Evaluation (Signed)
Physical Therapy Evaluation  Patient Details  Name: Jimmy Holland MRN: [id number] Date of Birth: April 26, 1956  Today's Date: 10/11/2013 Time: 1400-1430 PT Time Calculation (min): 30 min   Charges: 1 evaluation and 10 minutes TherEx ([id number])            Visit#: 1 of 8  Re-eval:   Assessment Diagnosis: s/p Lt hip replacement.  Surgical Date: 09/14/13 Next MD Visit: 6/25 and 29/15 Prior Therapy: yes [facility]  Authorization: [facility]    Authorization Time Period:    Authorization Visit#: 1 of 8   Past Medical History: No past medical history on file. Past Surgical History: No past surgical history on file.  Subjective Symptoms/Limitations Symptoms: patient seen s/p Lt hip replacement Pertinent History: Lt hip posterior approach hip replacement. history of cervical spine fusion. History of high blood pressure.  Limitations: Standing How long can you stand comfortably?: <1 hour How long can you walk comfortably?: pain in Rt side.  Repetition: Increases Symptoms Patient Stated Goals: To be able to stand, walk, cut grass, and wash car.  Pain Assessment Currently in Pain?: Yes Pain Score: 5  Pain Location: Back Pain Orientation: Right Pain Type: Surgical pain Pain Radiating Towards: Patient's Lyt hip only hurts with prolonged standing, pain is on lt lateteral thigh and radiated down the Lt LE to Lateral ankle and posterior knee Pain Relieving Factors: walkign in place exercises and stretchign it. massage on Rt hip.  Effect of Pain on Daily Activities: unable to perform prolonged walking, standing, washing car and mowing lawn.   Precautions/Restrictions  Precautions Precautions: Posterior Hip  Cognition/Observation Observation/Other Assessments Observations: li mted Rt side Frontal plane sway, limited Rt hip internal rotation Other Assessments: gait: wide steps, and unsteadiness, limite4d  hip mobility  Sensation/Coordination/Flexibility/Functional  Tests Flexibility Thomas: Positive Obers: Negative 90/90: Positive Functional Tests Functional Tests: Hip alignment: Rt hip anteriorly tilted, Lt hip posteriorly tilted, corrected with MET and LXX bridging.   Assessment LLE AROM (degrees) Left Hip Extension: 0 Left Hip External Rotation : 30 Left Hip Internal Rotation : 15 Left Ankle Dorsiflexion: 10 LLE Strength Left Hip Flexion: 4/5 Left Hip Extension: 2/5 Left Hip ABduction: 2/5 Left Knee Flexion: 3+/5 Left Knee Extension: 5/5 Left Ankle Dorsiflexion: 4/5  Exercise/Treatments Standing Other Standing Knee Exercises: 3D hip excursion 10x Supine Bridges: AAROM;Strengthening;10 reps;Limitations Bridges Limitations: Lt leg forward split stance.   Manual Therapy Manual Therapy: Joint mobilization Joint Mobilization: Muscle energy technique to correct Rt hip anterior tilt and Lt hip posteriro tilt   Physical Therapy Assessment and Plan PT Assessment and Plan Clinical Impression Statement: Patient dispalys Rt hip pain due to gait substition patterns following Lt hip replacement resulting in abnormal loading and limited weight shifting oto Lt LE due to decreased Lt LE strength and limited Lt LE mobility. Patient also dispalsy hip mis alignemnt with Rt hip anteriro tilt and Lt hip posteriro tilt, Followign muscle energy technique to correct hip alignment patient displays decreased pain with gait. Patient will benefit from skilled phsycial therapy to decrease Rt and Lt hip pain s/p hip replcement and to increase bilateral LE mobility and strengthe so patient can return to his normal activities and hoppies tht include mowing the lawn and washing his car.  Patient noted improved pain and demonstrated improved gait following correction of hip misalignment with muscle energy techniques PT Plan: Initiate LE stretching progorm (hamstrings, quads, piriformis, and hip flexors) next session and continue to reasse hip alaignment and progress trunk  stability to decrease pain.  As mobility and pain improveds shift focus to increase strength of bilateral glut med/max.      Goals Home Exercise Program Pt/caregiver will Perform Home Exercise Program: For increased ROM PT Goal: Perform Home Exercise Program - Progress: Goal set today PT Short Term Goals Time to Complete Short Term Goals: 2 weeks PT Short Term Goal 1: Patient will display hip alignment in neutral alignemnt upon arrival to therapy indicatign improved hip alignment stability.  PT Short Term Goal 2: Patient will display improved hamstring mobility Straight leg raise to 90 degrees,  PT Short Term Goal 3: patient will display increased hip abduction strength to 3/5 with manual muscl test indicatign improved Lt hip stability.  PT Short Term Goal 4: patient will display improved hip extension to 10 degrees so patient can ambualte with increased stride length PT Long Term Goals Time to Complete Long Term Goals: 8 weeks PT Long Term Goal 1: patient will display increased hip abduction strength to 4/5 with manual muscle test indicating improved Lt hip stability so patient can stand on Lt leg for 3 seconds PT Long Term Goal 2: Patient will display improved hip extension strength 4/5 so patiwent can perform sit to stand without UE assistance Long Term Goal 3: patiwent will be able to tolerate standnig >60 minutes so patient can toelrate washing car and mowing lawn.  Long Term Goal 4: Patient will display improved hip extension strength 5/5 so patiwent can perform ambulation up and down stairs withtou a hand rail.  PT Long Term Goal 5: patient will display increased hip abduction strength to 5/5 with manual muscle test indicating improved Lt hip stability so patient can stand on Lt leg for 10 seconds  Problem List Patient Active Problem List   Diagnosis Date Noted  . Thrombocytosis 09/28/2013  . Rheumatoid arthritis(714.0) 09/21/2013  . Avascular necrosis of bone of left hip 09/21/2013   . Cervical disc disorder with radiculopathy of cervical region 09/21/2013  . Hepatitis C 09/21/2013  . Acute blood loss anemia 09/21/2013  . Pain in joint, shoulder region 03/21/2012  . Muscle weakness (generalized) 03/21/2012    PT - End of Session Activity Tolerance: Patient tolerated treatment well General Behavior During Therapy: WFL for tasks assessed/performed PT Plan of Care PT Home Exercise Plan: Lt foot forward bridges 10x twice daily  GP Functional Assessment Tool Used: FOTO - limitation 34%, status 66% Functional Limitation: Mobility: Walking and moving around Mobility: Walking and Moving Around Current Status (V6720): At least 20 percent but less than 40 percent impaired, limited or restricted Mobility: Walking and Moving Around Goal Status [phone]): At least 1 percent but less than 20 percent impaired, limited or restricted  Leia Alf 10/11/2013, 4:13 PM  Physician Documentation Your signature is required to indicate approval of the treatment plan as stated above.  Please sign and either send electronically or make a copy of this report for your files and return this physician signed original.   Please mark one 1.__approve of plan  2. ___approve of plan with the following conditions.   ______________________________                                                          _____________________ Physician Signature  Date  

## 2013-10-17 ENCOUNTER — Ambulatory Visit (HOSPITAL_COMMUNITY)
Admission: RE | Admit: 2013-10-17 | Discharge: 2013-10-17 | Disposition: A | Discharge status: Home / Self Care | Payer: Non-veteran care | Source: Ambulatory Visit | Attending: Family Medicine | Admitting: Family Medicine

## 2013-10-17 NOTE — Progress Notes (Signed)
Physical Therapy Treatment Patient Details  Name: Jimmy Holland MRN: [id number] Date of Birth: 03/10/1956  Today's Date: 10/17/2013 Time: 4081-4481 PT Time Calculation (min): 25 min TE [id number] manual [id number]    Visit#: 2 of 8  Re-eval:      Authorization: [facility]  Authorization Time Period:    Authorization Visit#: 2 of     Subjective: Symptoms/Limitations Symptoms: Patient reports 6/10 on R hip (non operative LE) with posterior radiating pain to knee, no pain to L operable hip Pain Assessment Pain Score: 6  Pain Location: Hip Pain Orientation: Left  Precautions/Restrictions   posterior hip   Exercise/Treatments    Stretches Active Hamstring Stretch: 3 reps;30 seconds;Limitations Passive Hamstring Stretch:  (8" box) Quad Stretch: 3 reps;30 seconds;Limitations (hip flexor of other LE at same time) Hip Flexor Stretch: 3 reps;30 seconds;Limitations    Standing Heel Raises: 10 reps;Limitations (toe raises) Knee Flexion: 10 reps Other Standing Knee Exercises: 3D hip excursion 10x     Manual Therapy Manual Therapy: Other (comment) Joint Mobilization: muscle releases of bilateral illopsoas  Physical Therapy Assessment and Plan PT Assessment and Plan Clinical Impression Statement: Patient presents with R posterior hip pain today radiating to anterior knee, this is not surgical hip. SI joint assessed for alignment and found to be normal, Thomas test performed and neither LEs were able to touch the ground, good knee flexion so indication would be tight bilateral illopsoas muscles. Manual stretching and illiopsoas realeases done bilaterally as well as standing self stretch for patient to do daily at home 2/3x daily.. Standing strengthening therex also performed. Pt will benefit from skilled therapeutic intervention in order to improve on the following deficits: Impaired flexibility;Improper spinal/pelvic alignment;Abnormal gait;Decreased range of motion;Decreased  strength;Difficulty walking;Decreased safety awareness;Improper body mechanics PT Plan:  As mobility and pain improveds shift focus to increase strength of bilateral glut med/max.:Contine with PT pOc     Goals    Problem List Patient Active Problem List   Diagnosis Date Noted  . Thrombocytosis 09/28/2013  . Rheumatoid arthritis(714.0) 09/21/2013  . Avascular necrosis of bone of left hip 09/21/2013  . Cervical disc disorder with radiculopathy of cervical region 09/21/2013  . Hepatitis C 09/21/2013  . Acute blood loss anemia 09/21/2013  . Pain in joint, shoulder region 03/21/2012  . Muscle weakness (generalized) 03/21/2012    PT - End of Session Activity Tolerance: Patient tolerated treatment well PT Plan of Care PT Home Exercise Plan: added standing illopsoas B stretch 2/3x daily  GP    EAGLETON, [place] 10/17/2013, 3:44 PM

## 2013-10-19 ENCOUNTER — Telehealth (HOSPITAL_COMMUNITY): Payer: Self-pay

## 2013-10-19 ENCOUNTER — Inpatient Hospital Stay (HOSPITAL_COMMUNITY): Admission: RE | Admit: 2013-10-19 | Payer: Medicare Other | Source: Ambulatory Visit | Admitting: Physical Therapy

## 2013-10-23 ENCOUNTER — Ambulatory Visit (HOSPITAL_COMMUNITY): Payer: Medicare Other

## 2013-10-25 ENCOUNTER — Ambulatory Visit (HOSPITAL_COMMUNITY)
Admission: RE | Admit: 2013-10-25 | Discharge: 2013-10-25 | Disposition: A | Discharge status: Home / Self Care | Payer: Non-veteran care | Source: Ambulatory Visit | Attending: Family Medicine | Admitting: Family Medicine

## 2013-10-25 DIAGNOSIS — M25559 Pain in unspecified hip: Secondary | ICD-10-CM | POA: Insufficient documentation

## 2013-10-25 DIAGNOSIS — M25659 Stiffness of unspecified hip, not elsewhere classified: Secondary | ICD-10-CM | POA: Insufficient documentation

## 2013-10-25 DIAGNOSIS — R269 Unspecified abnormalities of gait and mobility: Secondary | ICD-10-CM | POA: Insufficient documentation

## 2013-10-25 DIAGNOSIS — IMO0001 Reserved for inherently not codable concepts without codable children: Secondary | ICD-10-CM | POA: Insufficient documentation

## 2013-10-25 NOTE — Progress Notes (Signed)
Physical Therapy Treatment Patient Details  Name: Jimmy Holland MRN: [id number] Date of Birth: 1955/06/06  Today's Date: 10/25/2013 Time: 6546-5035 PT Time Calculation (min): 42 min Charge: there ex [id number] Visit#: 3 of 8   Authorization: [facility]     Authorization Visit#: 3 of 8   Subjective: Symptoms/Limitations Symptoms: Pt has no pain in his Lt hip does have pain in his low back and Rt hip.    Exercise/Treatments   Stretches Active Hamstring Stretch: 30 seconds;Limitations;2 reps Active Hamstring Stretch Limitations: 3 directional Passive Hamstring Stretch:  (8" box) Quad Stretch:  (hip flexor of other LE at same time) Hip Flexor Stretch: 3 reps;30 seconds;Limitations Hip Flexor Stretch Limitations: on box lunging into Rt LE  Gastroc Stretch: 3 reps;30 seconds;Limitations Gastroc Stretch Limitations: on slant board  Soleus Stretch: Limitations Soleus Stretch Limitations: Standing extension 10 x 2 reps  Aerobic Stationary Bike: nustep L3 hills 3    Standing Heel Raises: 10 reps;Limitations (toe raises) Lateral Step Up: 10 reps;Step Height: 4" Forward Step Up: 10 reps;Step Height: 4" Functional Squat: 10 reps Rocker Board: 2 minutes SLS: 5 x 10" max  Other Standing Knee Exercises: sit to stand x 10     Physical Therapy Assessment and Plan PT Assessment and Plan Clinical Impression Statement: Pt ambulates with forward bent posiition of 30 degrees added trunk extension to correct this.  Pt very tight in all back and LE mm.  All exercises were facilitated by therapist to ensure correct form.  PT Plan: continue with balance and stretching as a focus.     Problem List Patient Active Problem List   Diagnosis Date Noted  . Thrombocytosis 09/28/2013  . Rheumatoid arthritis(714.0) 09/21/2013  . Avascular necrosis of bone of left hip 09/21/2013  . Cervical disc disorder with radiculopathy of cervical region 09/21/2013  . Hepatitis C 09/21/2013  . Acute blood loss  anemia 09/21/2013  . Pain in joint, shoulder region 03/21/2012  . Muscle weakness (generalized) 03/21/2012     GP    RUSSELL,CINDY 10/25/2013, 1:57 PM

## 2013-10-30 ENCOUNTER — Ambulatory Visit (HOSPITAL_COMMUNITY)
Admission: RE | Admit: 2013-10-30 | Discharge: 2013-10-30 | Disposition: A | Discharge status: Home / Self Care | Payer: Non-veteran care | Source: Ambulatory Visit | Attending: Physical Therapy | Admitting: Physical Therapy

## 2013-10-30 NOTE — Progress Notes (Signed)
Physical Therapy Treatment Patient Details  Name: Jimmy Holland MRN: [id number] Date of Birth: December 01, 1955  Today's Date: 10/30/2013 Time: 1355-1443 PT Time Calculation (min): 48 min Charge: TE [id number]   Visit#: 4 of 8  Re-eval: 11/08/13 Assessment Diagnosis: s/p Lt hip replacement.  Surgical Date: 09/14/13 Next MD Visit: [facility] september 2015 Prior Therapy: yes [facility]  Authorization: [facility]  Authorization Time Period:    Authorization Visit#: 4 of 8   Subjective: Symptoms/Limitations Symptoms: Pt reported compliance with HEP, stated he slept on belly for an hour everynight over the weekend.  Pain free Lt hip, all the pain in Rt hip Pain Assessment Currently in Pain?: Yes Pain Score: 3  Pain Location: Hip Pain Orientation: Right  Precautions/Restrictions  Precautions Precautions: Posterior Hip  Exercise/Treatments Stretches Active Hamstring Stretch: 30 seconds;Limitations;2 reps Active Hamstring Stretch Limitations: 3 directional Bil LE on 14 in box Hip Flexor Stretch: 3 reps;30 seconds;Limitations Hip Flexor Stretch Limitations: on 14 box lunging into Rt LE  Gastroc Stretch: 3 reps;30 seconds;Limitations Gastroc Stretch Limitations: on slant board  Aerobic Stationary Bike: nustep L3 hills 3 x 10 min Standing Heel Raises: 10 reps;Limitations Heel Raises Limitations: Toe raises 10x Lateral Step Up: 10 reps;Step Height: 4";Hand Hold: 1 Forward Step Up: 10 reps;Step Height: 4";Hand Hold: 0;Step Height: 6" Step Down: Left;10 reps;Hand Hold: 1;Step Height: 4" Functional Squat: 10 reps;Limitations Functional Squat Limitations: 3D hip excursion Rocker Board: 2 minutes SLS: Rt 40", Rt 45" Other Standing Knee Exercises: 3D hip excursion 10x     Physical Therapy Assessment and Plan PT Assessment and Plan Clinical Impression Statement: Pt progressing well towards goalst.  Improved posture through session with min cueing required to reduce forward flexion.  Balance  and overall stability improving, SLS 45" no HHA.  Began vector stance to improve hip stability wtih 1 HHA for balance and cueing for form.  Strength is progressing as well, increased step height with forward step up and began step down to improve eccentric quad control descending stairs.  Pt reported he felt better at end of session with improve gait mechanics and posture.   PT Plan: continue with balance and stretching as a focus.    Goals PT Short Term Goals PT Short Term Goal 2: Patient will display improved hamstring mobility Straight leg raise to 90 degrees,  PT Short Term Goal 2 - Progress: Progressing toward goal PT Short Term Goal 3: patient will display increased hip abduction strength to 3/5 with manual muscl test indicatign improved Lt hip stability.  PT Short Term Goal 3 - Progress: Progressing toward goal PT Short Term Goal 4: patient will display improved hip extension to 10 degrees so patient can ambualte with increased stride length PT Short Term Goal 4 - Progress: Progressing toward goal  Problem List Patient Active Problem List   Diagnosis Date Noted  . Thrombocytosis 09/28/2013  . Rheumatoid arthritis(714.0) 09/21/2013  . Avascular necrosis of bone of left hip 09/21/2013  . Cervical disc disorder with radiculopathy of cervical region 09/21/2013  . Hepatitis C 09/21/2013  . Acute blood loss anemia 09/21/2013  . Pain in joint, shoulder region 03/21/2012  . Muscle weakness (generalized) 03/21/2012    PT - End of Session Activity Tolerance: Patient tolerated treatment well General Behavior During Therapy: [facility] for tasks assessed/performed  GP    Aldona Lento 10/30/2013, 3:39 PM

## 2013-11-01 ENCOUNTER — Ambulatory Visit (HOSPITAL_COMMUNITY)
Admission: RE | Admit: 2013-11-01 | Discharge: 2013-11-01 | Disposition: A | Discharge status: Home / Self Care | Payer: Non-veteran care | Source: Ambulatory Visit | Attending: Physical Therapy | Admitting: Physical Therapy

## 2013-11-01 NOTE — Progress Notes (Signed)
Physical Therapy Treatment Patient Details  Name: Jimmy Holland MRN: [id number] Date of Birth: 10/21/1955  Today's Date: 11/01/2013 Time: 6222-9798 PT Time Calculation (min): 50 min Charge: TE [id number]   Visit#: 5 of 8  Re-eval: 11/08/13 Assessment Diagnosis: s/p Lt hip replacement.  Surgical Date: 09/14/13 Next MD Visit: [facility] september 2015 Prior Therapy: yes [facility]  Authorization: [facility]  Authorization Time Period:    Authorization Visit#: 5 of 8   Subjective: Symptoms/Limitations Symptoms: Pt stated he was a little sore following last session.  Does feel looser with improved abiilty following streteches. Pain Assessment Currently in Pain?: Yes Pain Score: 2  Pain Location: Hip Pain Orientation: Right  Precautions/Restrictions  Precautions Precautions: Posterior Hip  Exercise/Treatments Stretches Active Hamstring Stretch: 30 seconds;Limitations;3 reps Active Hamstring Stretch Limitations: 3 directional Bil LE on 14 in box Hip Flexor Stretch: 3 reps;30 seconds;Limitations Hip Flexor Stretch Limitations: on 14 box lunging into Rt LE  Gastroc Stretch: 3 reps;30 seconds;Limitations Gastroc Stretch Limitations: on slant board  Aerobic Tread Mill: 1.0-->1.2 mph x 8 min with cueing for posture and gait mechanics Standing Forward Lunges: Both;10 reps;Limitations Forward Lunges Limitations: anterior diagonal lunges on 6in step  Functional Squat: 10 reps;Limitations Functional Squat Limitations: 3D hip excursion Other Standing Knee Exercises: Lumbar extension 2x 10 Prone  Hip Extension: 5 reps;Both Other Prone Exercises: POE with manual faciliation 3 x  30" to improve lumbar extension Other Prone Exercises: heel squeeze 5x 5"   Manual Therapy Manual Therapy: Other (comment) Other Manual Therapy: thoracic and lumbar extension manual techniques  Physical Therapy Assessment and Plan PT Assessment and Plan Clinical Impression Statement: Session focus on improve  lumbar and hip extension with stretches, manual techniques and gluteal strengthening exercises. Began prone on POE with manual facilitation to improve hip extension.  Improved posture from 30 degrees to 14 degrees forward flexed trunk at end of session.  Began gait training  to improve gait mechanics with multimodal cueing to improve gait mechanics.  Pt reported pain reduced with improved gait mechanics at end of session and improve awareness of posture.   PT Plan: continue with balance and stretching as a focus.    Goals PT Short Term Goals PT Short Term Goal 1: Patient will display hip alignment in neutral alignemnt upon arrival to therapy indicatign improved hip alignment stability.  PT Short Term Goal 1 - Progress: Progressing toward goal PT Short Term Goal 2: Patient will display improved hamstring mobility Straight leg raise to 90 degrees,  PT Short Term Goal 2 - Progress: Progressing toward goal PT Short Term Goal 3: patient will display increased hip abduction strength to 3/5 with manual muscl test indicatign improved Lt hip stability.  PT Short Term Goal 3 - Progress: Progressing toward goal PT Short Term Goal 4: patient will display improved hip extension to 10 degrees so patient can ambualte with increased stride length PT Short Term Goal 4 - Progress: Progressing toward goal  Problem List Patient Active Problem List   Diagnosis Date Noted  . Thrombocytosis 09/28/2013  . Rheumatoid arthritis(714.0) 09/21/2013  . Avascular necrosis of bone of left hip 09/21/2013  . Cervical disc disorder with radiculopathy of cervical region 09/21/2013  . Hepatitis C 09/21/2013  . Acute blood loss anemia 09/21/2013  . Pain in joint, shoulder region 03/21/2012  . Muscle weakness (generalized) 03/21/2012    PT - End of Session Activity Tolerance: Patient tolerated treatment well General Behavior During Therapy: [facility] for tasks assessed/performed  GP  Aldona Lento 11/01/2013, 4:07  PM

## 2013-11-06 ENCOUNTER — Ambulatory Visit (HOSPITAL_COMMUNITY)
Admission: RE | Admit: 2013-11-06 | Discharge: 2013-11-06 | Disposition: A | Discharge status: Home / Self Care | Payer: Non-veteran care | Source: Ambulatory Visit | Attending: Family Medicine | Admitting: Family Medicine

## 2013-11-06 NOTE — Progress Notes (Signed)
Physical Therapy Treatment Patient Details  Name: Jimmy Holland MRN: [id number] Date of Birth: [birth year]  Today's Date: 11/06/2013 Time: 1355-1435 PT Time Calculation (min): 40 min  Visit#: 6 of 8  Re-eval: 11/08/13 Authorization: [facility]  Authorization Visit#: 6 of 8  Charges:  therex 38  Subjective: Symptoms/Limitations Symptoms: Pt showed 10 minutes late for appointment.  Pt currently reports same level of pain at 2/10 in Lt hip. Pain Assessment Currently in Pain?: Yes Pain Score: 2  Pain Location: Hip Pain Orientation: Left   Exercise/Treatments Stretches Active Hamstring Stretch: 30 seconds;Limitations;3 reps Active Hamstring Stretch Limitations: 3 directional Bil LE on 14 in box Hip Flexor Stretch: 3 reps;30 seconds;Limitations Hip Flexor Stretch Limitations: on 14 box lunging into Rt LE  Gastroc Stretch: 3 reps;30 seconds;Limitations Gastroc Stretch Limitations: on slant board  Aerobic Tread Mill: 1.0-->1.3 mph x 10 min with cueing for posture and gait mechanics Standing Forward Lunges: Both;10 reps;Limitations Forward Lunges Limitations: anterior diagonal lunges on 6in step  Functional Squat: 10 reps;Limitations Functional Squat Limitations: 3D hip excursion     Physical Therapy Assessment and Plan PT Assessment and Plan Clinical Impression Statement: Unable to complete full session due to time.  Pt able to increase ambulation time/distance with improving gait pattern and less cues.  Postural cues needed with ambulation and exercises.  Pt without complaints at end of session.  progressing well overall. PT Plan: continue with balance and stretching as a focus.  Add balance activities next visit.       Problem List Patient Active Problem List   Diagnosis Date Noted  . Thrombocytosis 09/28/2013  . Rheumatoid arthritis(714.0) 09/21/2013  . Avascular necrosis of bone of left hip 09/21/2013  . Cervical disc disorder with radiculopathy of cervical region  09/21/2013  . Hepatitis C 09/21/2013  . Acute blood loss anemia 09/21/2013  . Pain in joint, shoulder region 03/21/2012  . Muscle weakness (generalized) 03/21/2012    PT - End of Session Activity Tolerance: Patient tolerated treatment well General Behavior During Therapy: [facility] for tasks assessed/performed   Teena Irani, PTA/CLT 11/06/2013, 2:33 PM

## 2013-11-08 ENCOUNTER — Ambulatory Visit (HOSPITAL_COMMUNITY)
Admission: RE | Admit: 2013-11-08 | Discharge: 2013-11-08 | Disposition: A | Discharge status: Home / Self Care | Payer: Non-veteran care | Source: Ambulatory Visit | Attending: Family Medicine | Admitting: Family Medicine

## 2013-11-08 NOTE — Progress Notes (Signed)
Physical Therapy Re-evaluation/Treatment note  Patient Details  Name: Jimmy Holland MRN: [id number] Date of Birth: 25-Aug-1955  Today's Date: 11/08/2013 Time: 2334-3568 PT Time Calculation (min): 42 min Charge: MMT/ ROM Measurement 1348-1407, TE 1407-1430               Visit#: 7 of 15  Re-eval: 12/06/13 Assessment Diagnosis: s/p Lt hip replacement.  Surgical Date: 09/14/13 Next MD Visit: [facility] september 2015 Prior Therapy: yes [facility]  Authorization: [facility]    Authorization Time Period:    Authorization Visit#: 7 of 15   Subjective Symptoms/Limitations Symptoms: Pt stated pain scale 2/10 Rt side of lower back.  Reports Lt ankle swelling.  Pt stated he feels a 50% improvements. How long can you sit comfortably?: unlimited How long can you stand comfortably?: Able to stand for 1-2 hours comfortably (was <1 hour).   How long can you walk comfortably?: Walked 1/4 mile in 10 minutes with pain reduced following (was limited by pain in Rt side) Pain Assessment Currently in Pain?: Yes Pain Score: 2  Pain Location: Back Pain Orientation: Lower;Right  Precautions/Restrictions  Precautions Precautions: Posterior Hip  Cognition/Observation Observation/Other Assessments Observations:  (limited Rt side Frontal plane sway, limited Rt hip internal ) Other Assessments: Narrow base of suppost with gait, unsteadiness due to balance and hip mobilty (gait: wide steps, and unsteadiness, limite4d  hip mobility)  Sensation/Coordination/Flexibility/Functional Tests Flexibility Thomas: Positive Obers: Negative 90/90: Positive Functional Tests Functional Tests: SI within alingment, no MET required (Hip alignment: Rt hip anteriorly tilted, Lt hip posteriorly )  Assessment LLE AROM (degrees) Left Hip Extension: -8 (pt with forward flexed by 8 degrees was 0) Left Hip Flexion: 60 Left Hip External Rotation : 50 (was 30) Left Hip Internal Rotation : 17 (was 15) Left Ankle Dorsiflexion: 16  (was 10) LLE Strength Left Hip Flexion:  (4+/5 was 4/5) Left Hip Extension: 2+/5 (was 2/5) Left Hip ABduction: 4/5 (was 2/5) Left Knee Flexion:  (4+/5 was 3+/5) Left Knee Extension: 5/5 Left Ankle Dorsiflexion: 5/5 (was 4/5)  Exercise/Treatments Stretches Active Hamstring Stretch: 30 seconds;Limitations;3 reps Active Hamstring Stretch Limitations: 3 directional Bil LE on 14 in box Hip Flexor Stretch: 3 reps;30 seconds;Limitations Hip Flexor Stretch Limitations: on 14 box lunging into Rt LE  Standing Functional Squat: 10 reps;Limitations Functional Squat Limitations: 3D hip excursion SLS: Rt 58", LT 41" max of 3 Supine Bridges: 20 reps Prone  Hip Extension: 5 reps;Both Other Prone Exercises: POE with manual faciliation 3 x  30" to improve lumbar extension      Physical Therapy Assessment and Plan PT Assessment and Plan Clinical Impression Statement: Reassessment complete wtih the following findings:  Pt reports compliance with HEP stretches and strengtheing 2-3 times daily.  Pt reports increased tolerance for standing and walking comfortably.  ROM is improving though pt continues to be limited with forward flexed posture and restricted overall hip mobility.  Overall strength is improving, pt continues to have weak gluteal musculature affecting overall gait.  Pt will continues to benefit from skilled intervention to improve hip mobility and gluteal strengthening.   PT Plan: Recommend continuing OPPT for 4 more stretches to improve hip mobility and strengthening.  Add balance activities to improve stabiltiy with gait.  Gdoce next session.      Goals Home Exercise Program PT Goal: Perform Home Exercise Program - Progress: Met (2-3 times a day) PT Short Term Goals PT Short Term Goal 1: Patient will display hip alignment in neutral alignemnt upon arrival to therapy  indicatign improved hip alignment stability.  PT Short Term Goal 1 - Progress: Met PT Short Term Goal 2: Patient will  display improved hamstring mobility Straight leg raise to 90 degrees,  PT Short Term Goal 2 - Progress: Progressing toward goal PT Short Term Goal 3: patient will display increased hip abduction strength to 3/5 with manual muscl test indicatign improved Lt hip stability.  PT Short Term Goal 3 - Progress: Met PT Short Term Goal 4: patient will display improved hip extension to 10 degrees so patient can ambualte with increased stride length PT Short Term Goal 4 - Progress: Progressing toward goal PT Long Term Goals PT Long Term Goal 1: patient will display increased hip abduction strength to 4/5 with manual muscle test indicating improved Lt hip stability so patient can stand on Lt leg for 3 seconds PT Long Term Goal 1 - Progress: Met PT Long Term Goal 2: Patient will display improved hip extension strength 4/5 so patiwent can perform sit to stand without UE assistance PT Long Term Goal 2 - Progress: Progressing toward goal Long Term Goal 3: patiwent will be able to tolerate standnig >60 minutes so patient can toelrate washing car and mowing lawn.  Long Term Goal 3 Progress: Met Long Term Goal 4: Patient will display improved hip extension strength 5/5 so patiwent can perform ambulation up and down stairs withtou a hand rail.  Long Term Goal 4 Progress: Progressing toward goal PT Long Term Goal 5: patient will display increased hip abduction strength to 5/5 with manual muscle test indicating improved Lt hip stability so patient can stand on Lt leg for 10 seconds Long Term Goal 5 Progress: Progressing toward goal  Problem List Patient Active Problem List   Diagnosis Date Noted  . Thrombocytosis 09/28/2013  . Rheumatoid arthritis(714.0) 09/21/2013  . Avascular necrosis of bone of left hip 09/21/2013  . Cervical disc disorder with radiculopathy of cervical region 09/21/2013  . Hepatitis C 09/21/2013  . Acute blood loss anemia 09/21/2013  . Pain in joint, shoulder region 03/21/2012  . Muscle  weakness (generalized) 03/21/2012    PT - End of Session Activity Tolerance: Patient tolerated treatment well General Behavior During Therapy: [facility] for tasks assessed/performed  GP    Aldona Lento 11/08/2013, 3:40 PM  Physician Documentation Your signature is required to indicate approval of the treatment plan as stated above.  Please sign and either send electronically or make a copy of this report for your files and return this physician signed original.   Please mark one 1.__approve of plan  2. ___approve of plan with the following conditions.   ______________________________                                                          _____________________ Physician Signature  Date  

## 2013-11-09 NOTE — Progress Notes (Signed)
Physical Therapy Re-evaluation/Treatment note  Patient Details  Name: Jimmy Holland MRN: [id number] Date of Birth: 25-Aug-1955  Today's Date: 11/08/2013 Time: 2334-3568 PT Time Calculation (min): 42 min Charge: MMT/ ROM Measurement 1348-1407, TE 1407-1430               Visit#: 7 of 15  Re-eval: 12/06/13 Assessment Diagnosis: s/p Lt hip replacement.  Surgical Date: 09/14/13 Next MD Visit: [facility] september 2015 Prior Therapy: [facility]  Authorization: [facility]    Authorization Time Period:    Authorization Visit#: 7 of 15   Subjective Symptoms/Limitations Symptoms: Pt stated pain scale 2/10 Rt side of lower back.  Reports Lt ankle swelling.  Pt stated he feels a 50% improvements. How long can you sit comfortably?: unlimited How long can you stand comfortably?: Able to stand for 1-2 hours comfortably (was <1 hour).   How long can you walk comfortably?: Walked 1/4 mile in 10 minutes with pain reduced following (was limited by pain in Rt side) Pain Assessment Currently in Pain?: Yes Pain Score: 2  Pain Location: Back Pain Orientation: Lower;Right  Precautions/Restrictions  Precautions Precautions: Posterior Hip  Cognition/Observation Observation/Other Assessments Observations:  (limited Rt side Frontal plane sway, limited Rt hip internal ) Other Assessments: Narrow base of suppost with gait, unsteadiness due to balance and hip mobilty (gait: wide steps, and unsteadiness, limite4d  hip mobility)  Sensation/Coordination/Flexibility/Functional Tests Flexibility Thomas: Positive Obers: Negative 90/90: Positive Functional Tests Functional Tests: SI within alingment, no MET required (Hip alignment: Rt hip anteriorly tilted, Lt hip posteriorly )  Assessment LLE AROM (degrees) Left Hip Extension: -8 (pt with forward flexed by 8 degrees was 0) Left Hip Flexion: 60 Left Hip External Rotation : 50 (was 30) Left Hip Internal Rotation : 17 (was 15) Left Ankle Dorsiflexion: 16  (was 10) LLE Strength Left Hip Flexion:  (4+/5 was 4/5) Left Hip Extension: 2+/5 (was 2/5) Left Hip ABduction: 4/5 (was 2/5) Left Knee Flexion:  (4+/5 was 3+/5) Left Knee Extension: 5/5 Left Ankle Dorsiflexion: 5/5 (was 4/5)  Exercise/Treatments Stretches Active Hamstring Stretch: 30 seconds;Limitations;3 reps Active Hamstring Stretch Limitations: 3 directional Bil LE on 14 in box Hip Flexor Stretch: 3 reps;30 seconds;Limitations Hip Flexor Stretch Limitations: on 14 box lunging into Rt LE  Standing Functional Squat: 10 reps;Limitations Functional Squat Limitations: 3D hip excursion SLS: Rt 58", LT 41" max of 3 Supine Bridges: 20 reps Prone  Hip Extension: 5 reps;Both Other Prone Exercises: POE with manual faciliation 3 x  30" to improve lumbar extension      Physical Therapy Assessment and Plan PT Assessment and Plan Clinical Impression Statement: Reassessment complete wtih the following findings:  Pt reports compliance with HEP stretches and strengtheing 2-3 times daily.  Pt reports increased tolerance for standing and walking comfortably.  ROM is improving though pt continues to be limited with forward flexed posture and restricted overall hip mobility.  Overall strength is improving, pt continues to have weak gluteal musculature affecting overall gait.  Pt will continues to benefit from skilled intervention to improve hip mobility and gluteal strengthening.   PT Plan: Recommend continuing OPPT for 4 more stretches to improve hip mobility and strengthening.  Add balance activities to improve stabiltiy with gait.  Gdoce next session.      Goals Home Exercise Program PT Goal: Perform Home Exercise Program - Progress: Met (2-3 times a day) PT Short Term Goals PT Short Term Goal 1: Patient will display hip alignment in neutral alignemnt upon arrival to therapy  indicatign improved hip alignment stability.  PT Short Term Goal 1 - Progress: Met PT Short Term Goal 2: Patient will  display improved hamstring mobility Straight leg raise to 90 degrees,  PT Short Term Goal 2 - Progress: Progressing toward goal PT Short Term Goal 3: patient will display increased hip abduction strength to 3/5 with manual muscl test indicatign improved Lt hip stability.  PT Short Term Goal 3 - Progress: Met PT Short Term Goal 4: patient will display improved hip extension to 10 degrees so patient can ambualte with increased stride length PT Short Term Goal 4 - Progress: Progressing toward goal PT Long Term Goals PT Long Term Goal 1: patient will display increased hip abduction strength to 4/5 with manual muscle test indicating improved Lt hip stability so patient can stand on Lt leg for 3 seconds PT Long Term Goal 1 - Progress: Met PT Long Term Goal 2: Patient will display improved hip extension strength 4/5 so patiwent can perform sit to stand without UE assistance PT Long Term Goal 2 - Progress: Progressing toward goal Long Term Goal 3: patiwent will be able to tolerate standnig >60 minutes so patient can toelrate washing car and mowing lawn.  Long Term Goal 3 Progress: Met Long Term Goal 4: Patient will display improved hip extension strength 5/5 so patiwent can perform ambulation up and down stairs withtou a hand rail.  Long Term Goal 4 Progress: Progressing toward goal PT Long Term Goal 5: patient will display increased hip abduction strength to 5/5 with manual muscle test indicating improved Lt hip stability so patient can stand on Lt leg for 10 seconds Long Term Goal 5 Progress: Progressing toward goal  Problem List Patient Active Problem List   Diagnosis Date Noted  . Thrombocytosis 09/28/2013  . Rheumatoid arthritis(714.0) 09/21/2013  . Avascular necrosis of bone of left hip 09/21/2013  . Cervical disc disorder with radiculopathy of cervical region 09/21/2013  . Hepatitis C 09/21/2013  . Acute blood loss anemia 09/21/2013  . Pain in joint, shoulder region 03/21/2012  . Muscle  weakness (generalized) 03/21/2012    PT - End of Session Activity Tolerance: Patient tolerated treatment well General Behavior During Therapy: [facility] for tasks assessed/performed  GP    Aldona Lento 11/08/2013, 3:40 PM  Devona Konig PT DPT 11/09/13  Physician Documentation Your signature is required to indicate approval of the treatment plan as stated above.  Please sign and either send electronically or make a copy of this report for your files and return this physician signed original.   Please mark one 1.__approve of plan  2. ___approve of plan with the following conditions.   ______________________________                                                          _____________________ Physician Signature  Date  

## 2013-11-13 ENCOUNTER — Inpatient Hospital Stay (HOSPITAL_COMMUNITY): Admission: RE | Admit: 2013-11-13 | Payer: Medicare Other | Source: Ambulatory Visit

## 2013-11-13 ENCOUNTER — Telehealth (HOSPITAL_COMMUNITY): Payer: Self-pay

## 2013-11-15 ENCOUNTER — Ambulatory Visit (HOSPITAL_COMMUNITY)
Admission: RE | Admit: 2013-11-15 | Discharge: 2013-11-15 | Disposition: A | Discharge status: Home / Self Care | Payer: Non-veteran care | Source: Ambulatory Visit | Attending: Internal Medicine | Admitting: Internal Medicine

## 2013-11-15 NOTE — Progress Notes (Addendum)
Physical Therapy Treatment Patient Details  Name: Jimmy Holland MRN: [id number] Date of Birth: [birth year]  Today's Date: 11/15/2013 Time: 4098-1191 PT Time Calculation (min): 58 min Charge: TE [id number]   Visit#: 8 of 15  Re-eval: 12/06/13 Assessment Diagnosis: s/p Lt hip replacement.  Surgical Date: 09/14/13 Next MD Visit: [facility] september 2015 Prior Therapy: yes [facility]  [facility]ization: [facility]  Authorization Time Period:    Authorization Visit#: 8 of 15   Subjective: Symptoms/Limitations Symptoms: "Nagging" pain scale 3/10 Rt side of lower back.  Reports decreased swelling in ankles, Pain Assessment Currently in Pain?: Yes Pain Score: 3  Pain Location: Back Pain Orientation: Lower;Right  Precautions/Restrictions  Precautions Precautions: Posterior Hip  Exercise/Treatments Stretches Active Hamstring Stretch: 30 seconds;Limitations;3 reps Active Hamstring Stretch Limitations: 3 directional Bil LE on 14 in box Hip Flexor Stretch: 3 reps;30 seconds;Limitations Hip Flexor Stretch Limitations: on 14 box lunging into Rt LE  Standing Functional Squat: 15 reps;Limitations Functional Squat Limitations: 3D hip excursion SLS with Vectors: begin next session Other Standing Knee Exercises: Retro and tandem gait 2RT Other Standing Knee Exercises: Lumbar extension 10x Prone  Hip Extension: Both;10 reps Other Prone Exercises: POE with manual faciliation 3 x  30" to improve lumbar extension Other Prone Exercises: heel squeeze 10x 5"      Physical Therapy Assessment and Plan PT Assessment and Plan Clinical Impression Statement: Session focus on improving hip and lumbar mobiltiy and gluteal strengthening.  Added prone activites with therapist faciltation to improve lumbar extension, multimodal cueing to reduce compensation wtih gluteal strengthening activties.  Added balance gait  activities to improve stabilty wtih gait  PT Plan: Continue with current POC, focus on improving  lumbar extensin to reduce forward flexed, hip mobility, gluteal strengthening, balance activties to improve stabilty.  Begin vector stance next session.    Goals PT Short Term Goals PT Short Term Goal 4: patient will display improved hip extension to 10 degrees so patient can ambualte with increased stride length PT Short Term Goal 4 - Progress: Progressing toward goal PT Long Term Goals PT Long Term Goal 2: Patient will display improved hip extension strength 4/5 so patiwent can perform sit to stand without UE assistance PT Long Term Goal 2 - Progress: Progressing toward goal Long Term Goal 4: Patient will display improved hip extension strength 5/5 so patiwent can perform ambulation up and down stairs withtou a hand rail.  PT Long Term Goal 5: patient will display increased hip abduction strength to 5/5 with manual muscle test indicating improved Lt hip stability so patient can stand on Lt leg for 10 seconds  Problem List Patient Active Problem List   Diagnosis Date Noted  . Thrombocytosis 09/28/2013  . Rheumatoid arthritis(714.0) 09/21/2013  . Avascular necrosis of bone of left hip 09/21/2013  . Cervical disc disorder with radiculopathy of cervical region 09/21/2013  . Hepatitis C 09/21/2013  . Acute blood loss anemia 09/21/2013  . Pain in joint, shoulder region 03/21/2012  . Muscle weakness (generalized) 03/21/2012    PT - End of Session Activity Tolerance: Patient tolerated treatment well General Behavior During Therapy: WFL for tasks assessed/performed  GP Functional Assessment Tool Used: FOTO - limitation 13%, status 87%,  (limitated 34%, status 66%) Functional Limitation: Mobility: Walking and moving around Mobility: Walking and Moving Around Current Status ([id number]): At least 1 percent but less than 20 percent impaired, limited or restricted Mobility: Walking and Moving Around Goal Status [phone]): At least 1 percent but less than 20 percent impaired, limited  or  restricted  Aldona Lento 11/15/2013, 2:49 PM

## 2013-11-16 NOTE — Progress Notes (Signed)
Physical Therapy Treatment Patient Details  Name: MARCELLO TUZZOLINO MRN: [id number] Date of Birth: May 27, 1955  Today's Date: 11/15/2013 Time: 9381-0175 PT Time Calculation (min): 62 min Charge: TE [id number]   Visit#: 8 of 15  Re-eval: 12/06/13 Assessment Diagnosis: s/p Lt hip replacement.  Surgical Date: 09/14/13 Next MD Visit: [facility] september 2015 Prior Therapy: yes [facility]  Authorization: [facility]  Authorization Time Period:    Authorization Visit#: 8 of 15   Subjective: Symptoms/Limitations Symptoms: "Nagging" pain scale 3/10 Rt side of lower back.  Reports decreased swelling in ankles, Pain Assessment Currently in Pain?: Yes Pain Score: 3  Pain Location: Back Pain Orientation: Lower;Right  Precautions/Restrictions  Precautions Precautions: Posterior Hip  Exercise/Treatments Stretches Active Hamstring Stretch: 30 seconds;Limitations;3 reps Active Hamstring Stretch Limitations: 3 directional Bil LE on 14 in box Hip Flexor Stretch: 3 reps;30 seconds;Limitations Hip Flexor Stretch Limitations: on 14 box lunging into Rt LE  Standing Functional Squat: 15 reps;Limitations Functional Squat Limitations: 3D hip excursion SLS with Vectors: begin next session Other Standing Knee Exercises: Retro and tandem gait 2RT Other Standing Knee Exercises: Lumbar extension 10x Prone  Hip Extension: Both;10 reps Other Prone Exercises: POE with manual faciliation 3 x  30" to improve lumbar extension Other Prone Exercises: heel squeeze 10x 5"      Physical Therapy Assessment and Plan PT Assessment and Plan Clinical Impression Statement: Session focus on improving hip and lumbar mobiltiy and gluteal strengthening.  Added prone activites with therapist faciltation to improve lumbar extension, multimodal cueing to reduce compensation wtih gluteal strengthening activties.  Added balance gait  activities to improve stabilty wtih gait  PT Plan: Continue with current POC, focus on improving  lumbar extensin to reduce forward flexed, hip mobility, gluteal strengthening, balance activties to improve stabilty.  Begin vector stance next session.    Goals PT Short Term Goals PT Short Term Goal 4: patient will display improved hip extension to 10 degrees so patient can ambualte with increased stride length PT Short Term Goal 4 - Progress: Progressing toward goal PT Long Term Goals PT Long Term Goal 2: Patient will display improved hip extension strength 4/5 so patiwent can perform sit to stand without UE assistance PT Long Term Goal 2 - Progress: Progressing toward goal Long Term Goal 4: Patient will display improved hip extension strength 5/5 so patiwent can perform ambulation up and down stairs withtou a hand rail.  PT Long Term Goal 5: patient will display increased hip abduction strength to 5/5 with manual muscle test indicating improved Lt hip stability so patient can stand on Lt leg for 10 seconds  Problem List Patient Active Problem List   Diagnosis Date Noted  . Thrombocytosis 09/28/2013  . Rheumatoid arthritis(714.0) 09/21/2013  . Avascular necrosis of bone of left hip 09/21/2013  . Cervical disc disorder with radiculopathy of cervical region 09/21/2013  . Hepatitis C 09/21/2013  . Acute blood loss anemia 09/21/2013  . Pain in joint, shoulder region 03/21/2012  . Muscle weakness (generalized) 03/21/2012    PT - End of Session Activity Tolerance: Patient tolerated treatment well General Behavior During Therapy: WFL for tasks assessed/performed  GP Functional Assessment Tool Used: FOTO - limitation 13%, status 87%,  (limitated 34%, status 66%) Functional Limitation: Mobility: Walking and moving around Mobility: Walking and Moving Around Current Status ([id number]): At least 1 percent but less than 20 percent impaired, limited or restricted Mobility: Walking and Moving Around Goal Status [phone]): At least 1 percent but less than 20 percent impaired, limited  or  restricted  Aldona Lento 11/15/2013, 2:49 PM  Devona Konig PT DPT

## 2013-11-20 ENCOUNTER — Ambulatory Visit (HOSPITAL_COMMUNITY): Payer: Non-veteran care | Admitting: Physical Therapy

## 2013-11-22 ENCOUNTER — Ambulatory Visit (HOSPITAL_COMMUNITY): Payer: Non-veteran care | Admitting: Physical Therapy

## 2013-11-27 ENCOUNTER — Ambulatory Visit (HOSPITAL_COMMUNITY)
Admission: RE | Admit: 2013-11-27 | Discharge: 2013-11-27 | Disposition: A | Discharge status: Home / Self Care | Payer: Non-veteran care | Source: Ambulatory Visit | Attending: Family Medicine | Admitting: Family Medicine

## 2013-11-27 DIAGNOSIS — M25559 Pain in unspecified hip: Secondary | ICD-10-CM | POA: Diagnosis not present

## 2013-11-27 DIAGNOSIS — R269 Unspecified abnormalities of gait and mobility: Secondary | ICD-10-CM | POA: Insufficient documentation

## 2013-11-27 DIAGNOSIS — IMO0001 Reserved for inherently not codable concepts without codable children: Secondary | ICD-10-CM | POA: Insufficient documentation

## 2013-11-27 DIAGNOSIS — M25659 Stiffness of unspecified hip, not elsewhere classified: Secondary | ICD-10-CM | POA: Insufficient documentation

## 2013-11-27 NOTE — Progress Notes (Signed)
Physical Therapy Treatment Patient Details  Name: Jimmy Holland MRN: [id number] Date of Birth: 12-16-1955  Today's Date: 11/27/2013 Time: 1355-1433 PT Time Calculation (min): 76 min Charge: TE [id number]   Visit#: 9 of 15  Re-eval: 12/06/13 Assessment Diagnosis: s/p Lt hip replacement.  Surgical Date: 09/14/13 Next MD Visit: [facility] september 2015 Prior Therapy: yes [facility]  Authorization: [facility]  Authorization Time Period: GCode complete 8th visit  Authorization Visit#: 9 of 15   Subjective: Symptoms/Limitations Symptoms: Feeling good today, had family funeral and a bunch of family members at house.  Pt reports he rode his bicycle and went for light jog this weekend, feels he is getting stronger.   Pain Assessment Currently in Pain?: No/denies  Precautions/Restrictions  Precautions Precautions: Posterior Hip  Exercise/Treatments Stretches Active Hamstring Stretch: 30 seconds;Limitations;3 reps Active Hamstring Stretch Limitations: 3 directional Bil LE on 14 in box Hip Flexor Stretch: 3 reps;30 seconds;Limitations Hip Flexor Stretch Limitations: on 14 box lunging into Rt LE  Piriformis Stretch: 3 reps;30 seconds;Limitations Piriformis Stretch Limitations: seated  Standing Functional Squat: 15 reps;Limitations Functional Squat Limitations: 3D hip excursion SLS: Rt 25", Lt 22" max of 3 SLS with Vectors: 3x 5" with 1 finger HHA Other Standing Knee Exercises: Retro and tandem gait 2RT; Sidestepping with blue tband 2RT Other Standing Knee Exercises: Lumbar extension 10x; 3 reps squat reach matrix with 1#     Physical Therapy Assessment and Plan PT Assessment and Plan Clinical Impression Statement: Continued session focus on improviong hip and lumbar mobilty and gluteal strengtheing/stability.  Pt educated on importance of good posture and able to verbalize and demonstrate improved posture through out session with min cueing to reduce forward flexion trunk.  Progressed  baoance activties to vector stance to improve hip stability with one finger HHA for stabilty and form.  Pt able to demosntrate balance activties with no assistance required , ready for dynamic surface  with advance balance activiteis.  No reports of pain with improve hip mobilityi and posture noted with gait.   PT Plan: Continue with current POC, focus on improving lumbar extensin to reduce forward flexed, hip mobility, gluteal strengthening, balance activties to improve stabilty. Progress to dynamic surface with balance activites.      Goals PT Short Term Goals PT Short Term Goal 1: Patient will display hip alignment in neutral alignemnt upon arrival to therapy indicatign improved hip alignment stability.  PT Short Term Goal 2: Patient will display improved hamstring mobility Straight leg raise to 90 degrees,  PT Short Term Goal 2 - Progress: Progressing toward goal PT Short Term Goal 3: patient will display increased hip abduction strength to 3/5 with manual muscl test indicatign improved Lt hip stability.  PT Short Term Goal 4: patient will display improved hip extension to 10 degrees so patient can ambualte with increased stride length PT Short Term Goal 4 - Progress: Progressing toward goal PT Long Term Goals PT Long Term Goal 1: patient will display increased hip abduction strength to 4/5 with manual muscle test indicating improved Lt hip stability so patient can stand on Lt leg for 3 seconds PT Long Term Goal 2: Patient will display improved hip extension strength 4/5 so patiwent can perform sit to stand without UE assistance PT Long Term Goal 2 - Progress: Progressing toward goal Long Term Goal 3: patiwent will be able to tolerate standnig >60 minutes so patient can toelrate washing car and mowing lawn.  Long Term Goal 4: Patient will display improved hip extension strength  5/5 so patiwent can perform ambulation up and down stairs withtou a hand rail.  Long Term Goal 4 Progress: Progressing  toward goal PT Long Term Goal 5: patient will display increased hip abduction strength to 5/5 with manual muscle test indicating improved Lt hip stability so patient can stand on Lt leg for 10 seconds Long Term Goal 5 Progress: Progressing toward goal  Problem List Patient Active Problem List   Diagnosis Date Noted  . Thrombocytosis 09/28/2013  . Rheumatoid arthritis(714.0) 09/21/2013  . Avascular necrosis of bone of left hip 09/21/2013  . Cervical disc disorder with radiculopathy of cervical region 09/21/2013  . Hepatitis C 09/21/2013  . Acute blood loss anemia 09/21/2013  . Pain in joint, shoulder region 03/21/2012  . Muscle weakness (generalized) 03/21/2012    PT - End of Session Activity Tolerance: Patient tolerated treatment well General Behavior During Therapy: [facility] for tasks assessed/performed  GP    Aldona Lento 11/27/2013, 4:02 PM

## 2013-11-28 ENCOUNTER — Ambulatory Visit (HOSPITAL_COMMUNITY)
Admission: RE | Admit: 2013-11-28 | Discharge: 2013-11-28 | Disposition: A | Discharge status: Home / Self Care | Payer: Non-veteran care | Source: Ambulatory Visit | Attending: Family Medicine | Admitting: Family Medicine

## 2013-11-28 DIAGNOSIS — IMO0001 Reserved for inherently not codable concepts without codable children: Secondary | ICD-10-CM | POA: Diagnosis not present

## 2013-11-28 NOTE — Progress Notes (Signed)
Physical Therapy Treatment Patient Details  Name: Jimmy Holland MRN: [id number] Date of Birth: [birth year]  Today's Date: 11/28/2013 Time: 6314-9702 PT Time Calculation (min): 39 min Charge: TE 6378-5885  Visit#: 10 of 15  Re-eval: 12/06/13 Assessment Diagnosis: s/p Lt hip replacement.  Surgical Date: 09/14/13 Next MD Visit: [facility] september 2015 Prior Therapy: yes [facility]  Authorization: [facility]  Authorization Time Period: GCode complete 8th visit  Authorization Visit#: 10 of 15   Subjective: Symptoms/Limitations Symptoms: Feelilng good today, was a little sore following last session but was able to work it out this morning. Pain Assessment Currently in Pain?: No/denies  Precautions/Restrictions  Precautions Precautions: Posterior Hip  Exercise/Treatments Stretches Active Hamstring Stretch: 30 seconds;Limitations;3 reps Active Hamstring Stretch Limitations: 3 directional Bil LE on 14 in box Hip Flexor Stretch: 3 reps;30 seconds;Limitations Hip Flexor Stretch Limitations: on 14 box lunging into Rt LE  Piriformis Stretch: 3 reps;30 seconds;Limitations Piriformis Stretch Limitations: seated  Gastroc Stretch: 3 reps;30 seconds;Limitations Gastroc Stretch Limitations: on slant board  Standing Functional Squat: 15 reps;Limitations Functional Squat Limitations: 3D hip excursion SLS: Lt 47", Rt 43" max of 3 SLS with Vectors: 3x 5" with 1 finger HHA Gait Training: 3 RT demonstration with improved posture and hip mobility Other Standing Knee Exercises: Lumbar extension 10x; 5 reps squat reach matrix with 1#     Physical Therapy Assessment and Plan PT Assessment and Plan Clinical Impression Statement: Pt progressing well with overall hip mobility and gluteal strengthening with improved gait mechanics.  Pt able to demonstrate appropriate form and state appropriate hold time with all stretches and squats today.  Therapist facilitation to improve piriformis flexibilty with  stretches.  No reports of pain through session.   PT Plan: Continue with current POC, focus on improving lumbar extensin to reduce forward flexed, hip mobility, gluteal strengthening, balance activties to improve stabilty. Progress to dynamic surface with balance activites.      Goals PT Short Term Goals PT Short Term Goal 2: Patient will display improved hamstring mobility Straight leg raise to 90 degrees,  PT Short Term Goal 2 - Progress: Progressing toward goal PT Short Term Goal 4: patient will display improved hip extension to 10 degrees so patient can ambualte with increased stride length PT Short Term Goal 4 - Progress: Progressing toward goal PT Long Term Goals PT Long Term Goal 2: Patient will display improved hip extension strength 4/5 so patiwent can perform sit to stand without UE assistance PT Long Term Goal 2 - Progress: Progressing toward goal Long Term Goal 4: Patient will display improved hip extension strength 5/5 so patiwent can perform ambulation up and down stairs withtou a hand rail.  Long Term Goal 4 Progress: Progressing toward goal PT Long Term Goal 5: patient will display increased hip abduction strength to 5/5 with manual muscle test indicating improved Lt hip stability so patient can stand on Lt leg for 10 seconds Long Term Goal 5 Progress: Progressing toward goal  Problem List Patient Active Problem List   Diagnosis Date Noted  . Thrombocytosis 09/28/2013  . Rheumatoid arthritis(714.0) 09/21/2013  . Avascular necrosis of bone of left hip 09/21/2013  . Cervical disc disorder with radiculopathy of cervical region 09/21/2013  . Hepatitis C 09/21/2013  . Acute blood loss anemia 09/21/2013  . Pain in joint, shoulder region 03/21/2012  . Muscle weakness (generalized) 03/21/2012    PT - End of Session Activity Tolerance: Patient tolerated treatment well General Behavior During Therapy: [facility] for tasks assessed/performed  GP  Aldona Lento 11/28/2013,  3:17 PM

## 2013-11-29 ENCOUNTER — Ambulatory Visit (HOSPITAL_COMMUNITY): Payer: Medicare Other

## 2013-12-04 ENCOUNTER — Ambulatory Visit (HOSPITAL_COMMUNITY): Payer: Medicare Other | Admitting: Physical Therapy

## 2013-12-05 ENCOUNTER — Ambulatory Visit (HOSPITAL_COMMUNITY)
Admission: RE | Admit: 2013-12-05 | Discharge: 2013-12-05 | Disposition: A | Discharge status: Home / Self Care | Payer: Non-veteran care | Source: Ambulatory Visit | Attending: Family Medicine | Admitting: Family Medicine

## 2013-12-05 DIAGNOSIS — IMO0001 Reserved for inherently not codable concepts without codable children: Secondary | ICD-10-CM | POA: Diagnosis not present

## 2013-12-05 NOTE — Progress Notes (Signed)
Physical Therapy Treatment Patient Details  Name: Jimmy Holland MRN: [id number] Date of Birth: 1955-12-11  Today's Date: 12/05/2013 Time: 6599-3570 PT Time Calculation (min): 43 min    Charges: 1430-1500, Manual 1779-3903 Visit#: 11 of 15  Re-eval: 12/09/13    Authorization: [facility]  Authorization Time Period: GCode complete 8th visit  Authorization Visit#: 11 of     Subjective: Symptoms/Limitations Symptoms: Feelilng good today, was a little sore following last session notes operated leg and hip feel better than the uninvolved side.  Pain Assessment Currently in Pain?: Yes Pain Score: 2  Pain Location: Hip Pain Orientation: Right;Lower  Precautions/Restrictions     Exercise/Treatments Stretches Active Hamstring Stretch: 30 seconds;Limitations;3 reps Active Hamstring Stretch Limitations: 3 directional Bil LE on 14 in box Hip Flexor Stretch: 3 reps;Limitations Hip Flexor Stretch Limitations: on 14 box lunging into Rt LE 10x Gastroc Stretch: 3 reps;30 seconds;Limitations Gastroc Stretch Limitations: on slant board  Standing Other Standing Knee Exercises: 3D hip drives to 8" box 00P  Manual Therapy Joint Mobilization: hip extension mobilizations,  Other Manual Therapy: prone and supine thomas stretch  Physical Therapy Assessment and Plan PT Assessment and Plan Clinical Impression Statement: patient displayed continued limited hip mobility though it is much improved. this s4essin focused primarily on improving patient's hip extension to increase stride length and decrease lumbar spine compensation. patient noted decreased low back/hip pain and demsontrated increased stride length follwoign therapy.  PT Plan: Continue with current POC, focus on improving hip extension to reduce forward flexed lumbar spine posture,Progress gluteal strengthening.    Goals    Problem List Patient Active Problem List   Diagnosis Date Noted  . Thrombocytosis 09/28/2013  . Rheumatoid  arthritis(714.0) 09/21/2013  . Avascular necrosis of bone of left hip 09/21/2013  . Cervical disc disorder with radiculopathy of cervical region 09/21/2013  . Hepatitis C 09/21/2013  . Acute blood loss anemia 09/21/2013  . Pain in joint, shoulder region 03/21/2012  . Muscle weakness (generalized) 03/21/2012    PT - End of Session Activity Tolerance: Patient tolerated treatment well General Behavior During Therapy: WFL for tasks assessed/performed PT Plan of Care PT Home Exercise Plan: added standing illopsoas B stretch 2/3x daily  GP    Wanisha Shiroma R 12/05/2013, 4:05 PM

## 2013-12-06 ENCOUNTER — Ambulatory Visit (HOSPITAL_COMMUNITY): Payer: Medicare Other | Admitting: Physical Therapy

## 2013-12-12 ENCOUNTER — Ambulatory Visit (HOSPITAL_COMMUNITY)
Admission: RE | Admit: 2013-12-12 | Discharge: 2013-12-12 | Disposition: A | Discharge status: Home / Self Care | Payer: Non-veteran care | Source: Ambulatory Visit | Attending: Family Medicine | Admitting: Family Medicine

## 2013-12-12 DIAGNOSIS — IMO0001 Reserved for inherently not codable concepts without codable children: Secondary | ICD-10-CM | POA: Diagnosis not present

## 2013-12-12 NOTE — Progress Notes (Signed)
Physical Therapy Re-evaluation/Treatment Note/Discharge summary  Patient Details  Name: Jimmy Holland MRN: [id number] Date of Birth: 25-Jan-1956  Today's Date: 12/12/2013 Time: 7829-5621 PT Time Calculation (min): 32 min Charge:TE 1430-1500, MMT/ROM 3086-5784, FOTO no charge              Visit#: 12 of 15  Re-eval: 12/09/13 Assessment Diagnosis: s/p Lt hip replacement.  Surgical Date: 09/14/13 Next MD Visit: [facility] september 2015 Prior Therapy: yes [facility]  Authorization: [facility]    Authorization Time Period: GCode complete 8th visit  Authorization Visit#: 12 of 15   Subjective Symptoms/Limitations Symptoms: Pt reports he is feeling a lot better and feels ready to start doing this at home.  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 How long can you sit comfortably?: unlimited How long can you stand comfortably?: Able to stand for 1-2 hours comfortably How long can you walk comfortably?: Able to walk for 30 minutes comfortably (1 month ago was able to walk 1/4 mile in 10 minutes with pain reduced following) Pain Assessment Currently in Pain?: Yes Pain Score: 3  Pain Location: Back Pain Orientation: Lower;Right  Precautions/Restrictions  Precautions Precautions: Posterior Hip  Sensation/Coordination/Flexibility/Functional Tests Flexibility Thomas: Negative (Positive) Obers: Negative 90/90: Negative Functional Tests Functional Tests: SI within alignment Functional Tests: FOTO 83% status, limitiation 17% (Initial 66% status with limitation 34%)  Assessment LLE AROM (degrees) Left Hip Extension: 12 (was -8) Left Hip Flexion: 95 (was 60) Left Hip External Rotation : 60 (was 50) Left Hip Internal Rotation : 30 (was 17) Left Ankle Dorsiflexion: 22 (was 16) LLE Strength Left Hip Flexion: 5/5 (was 4+/5) Left Hip Extension: 3+/5 (was 2+/5) Left Hip ABduction: 5/5 (was 4/5) Left Hip ADduction:  (was 4+/5) Left Knee Flexion: 5/5 Left  Knee Extension: 5/5 Left Ankle Dorsiflexion: 5/5  Exercise/Treatments Stretches Active Hamstring Stretch: 3 reps;30 seconds;Limitations Active Hamstring Stretch Limitations: 3 directional Bil LE on 14 in box Hip Flexor Stretch: 3 reps;Limitations Hip Flexor Stretch Limitations: Bil LE on 6in box Gastroc Stretch: 3 reps;30 seconds;Limitations Gastroc Stretch Limitations: on slant board  Standing Functional Squat: 15 reps;Limitations Functional Squat Limitations: 3D hip excursion Stairs: 2RT reciprocal pattern no HHA SLS: 60"+ first attempt SLS with Vectors: 3x 5" no HHA    Physical Therapy Assessment and Plan PT Assessment and Plan Clinical Impression Statement: Reassessment complete with the following findings:  Pt independent with HEP daily and able to demonstrate approriate technique with all exercises.  Pt reports increased ability to sit, stand and walk for longer periods of time comfortably.  Improved AROM for LE with noted improved posture.  Improved strength to 5/5 for all musculature except for gluteus maximus.  Pt able to SLS 60"+ first attempt and demonstrate reciprocal pattern ascending and descending with no HHA.  Pt feels he is ready for discharge and plans to join [employer] to continue gluteal strengthening.  Pt given HEP exercises for gluteal strengthening and able to demonstrate correct form and technique with all HEP.  Pt plans to see MD about LBP Rt side.   PT Plan: D/C to HEP/Participation at the [facility].    Goals Home Exercise Program PT Goal: Perform Home Exercise Program - Progress: Met PT Short Term Goals PT Short Term Goal 1: Patient will display hip alignment in neutral alignemnt upon arrival to therapy indicatign improved hip alignment stability.  PT Short Term Goal 1 - Progress: Met PT Short Term Goal 2: Patient will display improved hamstring  mobility Straight leg raise to 90 degrees,  PT Short Term Goal 2 - Progress: Met PT Short Term Goal 3: patient will display  increased hip abduction strength to 3/5 with manual muscl test indicatign improved Lt hip stability.  PT Short Term Goal 3 - Progress: Met PT Short Term Goal 4: patient will display improved hip extension to 10 degrees so patient can ambualte with increased stride length PT Short Term Goal 4 - Progress: Met PT Long Term Goals PT Long Term Goal 1: patient will display increased hip abduction strength to 4/5 with manual muscle test indicating improved Lt hip stability so patient can stand on Lt leg for 3 seconds PT Long Term Goal 1 - Progress: Met PT Long Term Goal 2: Patient will display improved hip extension strength 4/5 so patiwent can perform sit to stand without UE assistance PT Long Term Goal 2 - Progress: Partly met (Hip extension 3+/5, able to complate STS no HHA) Long Term Goal 3: patiwent will be able to tolerate standnig >60 minutes so patient can toelrate washing car and mowing lawn.  Long Term Goal 3 Progress: Met Long Term Goal 4: Patient will display improved hip extension strength 5/5 so patiwent can perform ambulation up and down stairs withtou a hand rail.  Long Term Goal 4 Progress: Partly met (Hip extension 3+/5, able to ascend/descend stairs no HHA) PT Long Term Goal 5: patient will display increased hip abduction strength to 5/5 with manual muscle test indicating improved Lt hip stability so patient can stand on Lt leg for 10 seconds Long Term Goal 5 Progress: Met (5/5 abduction, SLS 60"+)  Problem List Patient Active Problem List   Diagnosis Date Noted  . Thrombocytosis 09/28/2013  . Rheumatoid arthritis(714.0) 09/21/2013  . Avascular necrosis of bone of left hip 09/21/2013  . Cervical disc disorder with radiculopathy of cervical region 09/21/2013  . Hepatitis C 09/21/2013  . Acute blood loss anemia 09/21/2013  . Pain in joint, shoulder region 03/21/2012  . Muscle weakness (generalized) 03/21/2012    PT - End of Session Activity Tolerance: Patient tolerated  treatment well General Behavior During Therapy: WFL for tasks assessed/performed  GP Functional Assessment Tool Used: FOTO - limitation 13%, status 87%,  (limitated 34%, status 66%) Functional Limitation: Mobility: Walking and moving around Mobility: Walking and Moving Around Goal Status [phone]): At least 1 percent but less than 20 percent impaired, limited or restricted Mobility: Walking and Moving Around Discharge Status [phone]): At least 1 percent but less than 20 percent impaired, limited or restricted  Aldona Lento 12/12/2013, 3:38 PM  Physician Documentation Your signature is required to indicate approval of the treatment plan as stated above.  Please sign and either send electronically or make a copy of this report for your files and return this physician signed original.   Please mark one 1.__approve of plan  2. ___approve of plan with the following conditions.   ______________________________                                                          _____________________ Physician Signature  Date  

## 2013-12-13 NOTE — Progress Notes (Signed)
Physical Therapy Re-evaluation/Treatment Note/Discharge summary  Patient Details  Name: Jimmy Holland MRN: [id number] Date of Birth: 25-Jan-1956  Today's Date: 12/12/2013 Time: 7829-5621 PT Time Calculation (min): 32 min Charge:TE 1430-1500, MMT/ROM 3086-5784, FOTO no charge              Visit#: 12 of 15  Re-eval: 12/09/13 Assessment Diagnosis: s/p Lt hip replacement.  Surgical Date: 09/14/13 Next MD Visit: [facility] september 2015 Prior Therapy: [facility]  Authorization: [facility]    Authorization Time Period: GCode complete 8th visit  Authorization Visit#: 12 of 15   Subjective Symptoms/Limitations Symptoms: Pt reports he is feeling a lot better and feels ready to start doing this at home.  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 How long can you sit comfortably?: unlimited How long can you stand comfortably?: Able to stand for 1-2 hours comfortably How long can you walk comfortably?: Able to walk for 30 minutes comfortably (1 month ago was able to walk 1/4 mile in 10 minutes with pain reduced following) Pain Assessment Currently in Pain?: Yes Pain Score: 3  Pain Location: Back Pain Orientation: Lower;Right  Precautions/Restrictions  Precautions Precautions: Posterior Hip  Sensation/Coordination/Flexibility/Functional Tests Flexibility Thomas: Negative (Positive) Obers: Negative 90/90: Negative Functional Tests Functional Tests: SI within alignment Functional Tests: FOTO 83% status, limitiation 17% (Initial 66% status with limitation 34%)  Assessment LLE AROM (degrees) Left Hip Extension: 12 (was -8) Left Hip Flexion: 95 (was 60) Left Hip External Rotation : 60 (was 50) Left Hip Internal Rotation : 30 (was 17) Left Ankle Dorsiflexion: 22 (was 16) LLE Strength Left Hip Flexion: 5/5 (was 4+/5) Left Hip Extension: 3+/5 (was 2+/5) Left Hip ABduction: 5/5 (was 4/5) Left Hip ADduction:  (was 4+/5) Left Knee Flexion: 5/5 Left  Knee Extension: 5/5 Left Ankle Dorsiflexion: 5/5  Exercise/Treatments Stretches Active Hamstring Stretch: 3 reps;30 seconds;Limitations Active Hamstring Stretch Limitations: 3 directional Bil LE on 14 in box Hip Flexor Stretch: 3 reps;Limitations Hip Flexor Stretch Limitations: Bil LE on 6in box Gastroc Stretch: 3 reps;30 seconds;Limitations Gastroc Stretch Limitations: on slant board  Standing Functional Squat: 15 reps;Limitations Functional Squat Limitations: 3D hip excursion Stairs: 2RT reciprocal pattern no HHA SLS: 60"+ first attempt SLS with Vectors: 3x 5" no HHA    Physical Therapy Assessment and Plan PT Assessment and Plan Clinical Impression Statement: Reassessment complete with the following findings:  Pt independent with HEP daily and able to demonstrate approriate technique with all exercises.  Pt reports increased ability to sit, stand and walk for longer periods of time comfortably.  Improved AROM for LE with noted improved posture.  Improved strength to 5/5 for all musculature except for gluteus maximus.  Pt able to SLS 60"+ first attempt and demonstrate reciprocal pattern ascending and descending with no HHA.  Pt feels he is ready for discharge and plans to join [employer] to continue gluteal strengthening.  Pt given HEP exercises for gluteal strengthening and able to demonstrate correct form and technique with all HEP.  Pt plans to see MD about LBP Rt side.   PT Plan: D/C to HEP/Participation at the [facility].    Goals Home Exercise Program PT Goal: Perform Home Exercise Program - Progress: Met PT Short Term Goals PT Short Term Goal 1: Patient will display hip alignment in neutral alignemnt upon arrival to therapy indicatign improved hip alignment stability.  PT Short Term Goal 1 - Progress: Met PT Short Term Goal 2: Patient will display improved hamstring  mobility Straight leg raise to 90 degrees,  PT Short Term Goal 2 - Progress: Met PT Short Term Goal 3: patient will display  increased hip abduction strength to 3/5 with manual muscl test indicatign improved Lt hip stability.  PT Short Term Goal 3 - Progress: Met PT Short Term Goal 4: patient will display improved hip extension to 10 degrees so patient can ambualte with increased stride length PT Short Term Goal 4 - Progress: Met PT Long Term Goals PT Long Term Goal 1: patient will display increased hip abduction strength to 4/5 with manual muscle test indicating improved Lt hip stability so patient can stand on Lt leg for 3 seconds PT Long Term Goal 1 - Progress: Met PT Long Term Goal 2: Patient will display improved hip extension strength 4/5 so patiwent can perform sit to stand without UE assistance PT Long Term Goal 2 - Progress: Partly met (Hip extension 3+/5, able to complate STS no HHA) Long Term Goal 3: patiwent will be able to tolerate standnig >60 minutes so patient can toelrate washing car and mowing lawn.  Long Term Goal 3 Progress: Met Long Term Goal 4: Patient will display improved hip extension strength 5/5 so patiwent can perform ambulation up and down stairs withtou a hand rail.  Long Term Goal 4 Progress: Partly met (Hip extension 3+/5, able to ascend/descend stairs no HHA) PT Long Term Goal 5: patient will display increased hip abduction strength to 5/5 with manual muscle test indicating improved Lt hip stability so patient can stand on Lt leg for 10 seconds Long Term Goal 5 Progress: Met (5/5 abduction, SLS 60"+)  Problem List Patient Active Problem List   Diagnosis Date Noted  . Thrombocytosis 09/28/2013  . Rheumatoid arthritis(714.0) 09/21/2013  . Avascular necrosis of bone of left hip 09/21/2013  . Cervical disc disorder with radiculopathy of cervical region 09/21/2013  . Hepatitis C 09/21/2013  . Acute blood loss anemia 09/21/2013  . Pain in joint, shoulder region 03/21/2012  . Muscle weakness (generalized) 03/21/2012    PT - End of Session Activity Tolerance: Patient tolerated  treatment well General Behavior During Therapy: WFL for tasks assessed/performed  GP Functional Assessment Tool Used: FOTO - limitation 13%, status 87%,  (limitated 34%, status 66%) Functional Limitation: Mobility: Walking and moving around Mobility: Walking and Moving Around Goal Status [phone]): At least 1 percent but less than 20 percent impaired, limited or restricted Mobility: Walking and Moving Around Discharge Status [phone]): At least 1 percent but less than 20 percent impaired, limited or restricted  Aldona Lento 12/12/2013, 3:38 PM  Devona Konig PT DPT  Physician Documentation Your signature is required to indicate approval of the treatment plan as stated above.  Please sign and either send electronically or make a copy of this report for your files and return this physician signed original.   Please mark one 1.__approve of plan  2. ___approve of plan with the following conditions.   ______________________________                                                          _____________________ Physician Signature  Date  

## 2013-12-15 ENCOUNTER — Ambulatory Visit (HOSPITAL_COMMUNITY)
Admission: RE | Admit: 2013-12-15 | Payer: Non-veteran care | Source: Ambulatory Visit | Attending: Family Medicine | Admitting: Family Medicine

## 2013-12-19 ENCOUNTER — Ambulatory Visit (HOSPITAL_COMMUNITY): Payer: Medicare Other | Admitting: Physical Therapy

## 2013-12-21 ENCOUNTER — Ambulatory Visit (HOSPITAL_COMMUNITY): Payer: Medicare Other | Admitting: Physical Therapy

## 2013-12-26 ENCOUNTER — Ambulatory Visit (HOSPITAL_COMMUNITY): Payer: Medicare Other | Admitting: Physical Therapy

## 2013-12-28 ENCOUNTER — Ambulatory Visit (HOSPITAL_COMMUNITY): Payer: Medicare Other | Admitting: Physical Therapy

## 2016-12-29 ENCOUNTER — Emergency Department (HOSPITAL_COMMUNITY)
Admission: EM | Admit: 2016-12-29 | Discharge: 2016-12-29 | Disposition: A | Discharge status: Home / Self Care | Payer: Non-veteran care | Attending: Emergency Medicine | Admitting: Emergency Medicine

## 2016-12-29 ENCOUNTER — Emergency Department (HOSPITAL_COMMUNITY): Payer: Non-veteran care

## 2016-12-29 ENCOUNTER — Encounter (HOSPITAL_COMMUNITY): Payer: Self-pay | Admitting: *Deleted

## 2016-12-29 DIAGNOSIS — Z7901 Long term (current) use of anticoagulants: Secondary | ICD-10-CM | POA: Insufficient documentation

## 2016-12-29 DIAGNOSIS — Z96649 Presence of unspecified artificial hip joint: Secondary | ICD-10-CM | POA: Diagnosis not present

## 2016-12-29 DIAGNOSIS — Y999 Unspecified external cause status: Secondary | ICD-10-CM | POA: Insufficient documentation

## 2016-12-29 DIAGNOSIS — I82412 Acute embolism and thrombosis of left femoral vein: Secondary | ICD-10-CM

## 2016-12-29 DIAGNOSIS — Y9389 Activity, other specified: Secondary | ICD-10-CM | POA: Insufficient documentation

## 2016-12-29 DIAGNOSIS — Y929 Unspecified place or not applicable: Secondary | ICD-10-CM | POA: Insufficient documentation

## 2016-12-29 DIAGNOSIS — X500XXA Overexertion from strenuous movement or load, initial encounter: Secondary | ICD-10-CM | POA: Diagnosis not present

## 2016-12-29 DIAGNOSIS — S8992XA Unspecified injury of left lower leg, initial encounter: Secondary | ICD-10-CM | POA: Diagnosis present

## 2016-12-29 DIAGNOSIS — S8012XA Contusion of left lower leg, initial encounter: Secondary | ICD-10-CM | POA: Diagnosis not present

## 2016-12-29 DIAGNOSIS — T148XXA Other injury of unspecified body region, initial encounter: Secondary | ICD-10-CM

## 2016-12-29 DIAGNOSIS — F17210 Nicotine dependence, cigarettes, uncomplicated: Secondary | ICD-10-CM | POA: Insufficient documentation

## 2016-12-29 HISTORY — DX: Personal history of other venous thrombosis and embolism: Z86.718

## 2016-12-29 HISTORY — DX: Unspecified osteoarthritis, unspecified site: M19.90

## 2016-12-29 LAB — CBC WITH DIFFERENTIAL/PLATELET
BASOS PCT: 1 %
Basophils Absolute: 0 10*3/uL (ref 0.0–0.1)
EOS PCT: 4 %
Eosinophils Absolute: 0.3 10*3/uL (ref 0.0–0.7)
HCT: 44 % (ref 39.0–52.0)
Hemoglobin: 14.6 g/dL (ref 13.0–17.0)
LYMPHS ABS: 2.3 10*3/uL (ref 0.7–4.0)
Lymphocytes Relative: 37 %
MCH: 31.5 pg (ref 26.0–34.0)
MCHC: 33.2 g/dL (ref 30.0–36.0)
MCV: 95 fL (ref 78.0–100.0)
Monocytes Absolute: 0.8 10*3/uL (ref 0.1–1.0)
Monocytes Relative: 12 %
Neutro Abs: 2.9 10*3/uL (ref 1.7–7.7)
Neutrophils Relative %: 46 %
Platelets: 308 10*3/uL (ref 150–400)
RBC: 4.63 MIL/uL (ref 4.22–5.81)
RDW: 13.6 % (ref 11.5–15.5)
WBC: 6.3 10*3/uL (ref 4.0–10.5)

## 2016-12-29 LAB — COMPREHENSIVE METABOLIC PANEL
ALT: 13 U/L — AB (ref 17–63)
AST: 23 U/L (ref 15–41)
Albumin: 4 g/dL (ref 3.5–5.0)
Alkaline Phosphatase: 97 U/L (ref 38–126)
Anion gap: 8 (ref 5–15)
BUN: 11 mg/dL (ref 6–20)
CO2: 25 mmol/L (ref 22–32)
CREATININE: 0.87 mg/dL (ref 0.61–1.24)
Calcium: 9 mg/dL (ref 8.9–10.3)
Chloride: 100 mmol/L — ABNORMAL LOW (ref 101–111)
GFR calc Af Amer: >60 mL/min (ref >60)
GFR calc non Af Amer: >60 mL/min (ref >60)
Glucose, Bld: 84 mg/dL (ref 65–99)
POTASSIUM: 3.8 mmol/L (ref 3.5–5.1)
Sodium: 133 mmol/L — ABNORMAL LOW (ref 135–145)
Total Bilirubin: 0.4 mg/dL (ref 0.3–1.2)
Total Protein: 7.9 g/dL (ref 6.5–8.1)

## 2016-12-29 LAB — PROTIME-INR
INR: 1
PROTHROMBIN TIME: 13.1 s (ref 11.4–15.2)

## 2016-12-29 MED ORDER — RIVAROXABAN 15 MG PO TABS
15.0000 mg | ORAL_TABLET | Freq: Once | ORAL | Status: AC
  Administered 2016-12-29: 15 mg via ORAL
  Filled 2016-12-29 (×2): qty 1

## 2016-12-29 MED ORDER — RIVAROXABAN (XARELTO) VTE STARTER PACK (15 & 20 MG): 15.0000 mg | ORAL_TABLET | ORAL | 0 refills | Status: DC

## 2016-12-29 NOTE — ED Provider Notes (Signed)
Emergency Department Provider Note   I have reviewed the triage vital signs and the nursing notes.   HISTORY  Chief Complaint Leg Pain   HPI Jimmy Holland is a 61 y.o. male with PMH of RA, Hep C treated with Harvoni, and prior provoked DVT presents to the emergency room in for evaluation of left leg swelling. The patient reports that 3 days ago he began experiencing left calf tenderness. He was bending over to pick setting up off the ground when he felt sudden pain. The pain has worsened along with swelling. He denies any redness, fever, chills. No increased warmth in the leg. He does have a history of DVT in the past which occurred after hip replacement. He was started on Xarelto at that time and completed a 3 month course. No complications during that time.    Past Medical History:  Diagnosis Date  . Arthritis   . History of blood clots     Patient Active Problem List   Diagnosis Date Noted  . Thrombocytosis ([place]) 09/28/2013  . Rheumatoid arthritis(714.0) 09/21/2013  . Avascular necrosis of bone of left hip ([place]) 09/21/2013  . Cervical disc disorder with radiculopathy of cervical region 09/21/2013  . Hepatitis C 09/21/2013  . Acute blood loss anemia 09/21/2013  . Pain in joint, shoulder region 03/21/2012  . Muscle weakness (generalized) 03/21/2012    Past Surgical History:  Procedure Laterality Date  . HIP SURGERY    . NECK SURGERY      Current Outpatient Rx  . Order #: [id number] Class: Historical Med  . Order #: [id number] Class: Historical Med  . Order #: [id number] Class: Print  . Order #: [id number] Class: Historical Med  . Order #: [id number] Class: Historical Med  . Order #: [id number] Class: Print    Allergies Patient has no known allergies.  No family history on file.  Social History Social History  Substance Use Topics  . Smoking status: Current Every Day Smoker    Packs/day: 0.50    Types: Cigarettes  . Smokeless tobacco: Never Used  . Alcohol use 8.4  oz/week    14 Cans of beer per week    Review of Systems  Constitutional: No fever/chills Eyes: No visual changes. ENT: No sore throat. Cardiovascular: Denies chest pain. Respiratory: Denies shortness of breath. Gastrointestinal: No abdominal pain.  No nausea, no vomiting.  No diarrhea.  No constipation. Genitourinary: Negative for dysuria. Musculoskeletal: Negative for back pain. Positive left calf swelling.  Skin: Negative for rash. Neurological: Negative for headaches, focal weakness or numbness.  10-point ROS otherwise negative.  ____________________________________________   PHYSICAL EXAM:  VITAL SIGNS: ED Triage Vitals  Enc Vitals Group     BP 12/29/16 1706 127/89     Pulse Rate 12/29/16 1706 (!) 104     Resp 12/29/16 1706 18     Temp 12/29/16 1706 98.3 F (36.8 C)     Temp Source 12/29/16 1706 Oral     SpO2 12/29/16 1706 97 %     Weight 12/29/16 1707 150 lb (68 kg)     Height 12/29/16 1707 6' (1.829 m)     Pain Score 12/29/16 1706 6   Constitutional: Alert and oriented. Well appearing and in no acute distress. Eyes: Conjunctivae are normal.  Head: Atraumatic. Nose: No congestion/rhinnorhea. Mouth/Throat: Mucous membranes are moist.  Oropharynx non-erythematous. Neck: No stridor.  Cardiovascular: Normal rate, regular rhythm. Good peripheral circulation. Grossly normal heart sounds.   Respiratory: Normal respiratory effort.  No retractions. Lungs CTAB. Gastrointestinal: Soft  and nontender. No distention.  Musculoskeletal: No lower extremity tenderness. Positive left calf edema. No gross deformities of extremities. Neurologic:  Normal speech and language. No gross focal neurologic deficits are appreciated.  Skin:  Skin is warm, dry and intact. No rash noted. No warmth or redness over the calf.   ____________________________________________   LABS (all labs ordered are listed, but only abnormal results are displayed)  Labs Reviewed  COMPREHENSIVE METABOLIC  PANEL - Abnormal; Notable for the following:       Result Value   Sodium 133 (*)    Chloride 100 (*)    ALT 13 (*)    All other components within normal limits  CBC WITH DIFFERENTIAL/PLATELET  PROTIME-INR   ____________________________________________  RADIOLOGY  US Venous Img Lower Unilateral Left  Result Date: 12/29/2016 CLINICAL DATA:  Lower extremity edema for 3 days EXAM: LEFT LOWER EXTREMITY VENOUS DUPLEX ULTRASOUND TECHNIQUE: Gray-scale sonography with graded compression, as well as color Doppler and duplex ultrasound were performed to evaluate the left lower extremity deep venous system from the level of the common femoral vein and including the common femoral, femoral, profunda femoral, popliteal and calf veins including the posterior tibial, peroneal and gastrocnemius veins when visible. The superficial great saphenous vein was also interrogated. Spectral Doppler was utilized to evaluate flow at rest and with distal augmentation maneuvers in the common femoral, femoral and popliteal veins. COMPARISON:  Sep 20, 2013 FINDINGS: Contralateral Common Femoral Vein: Respiratory phasicity is normal and symmetric with the symptomatic side. No evidence of thrombus. Normal compressibility. Common Femoral Vein: No evidence of thrombus. Normal compressibility, respiratory phasicity and response to augmentation. Saphenofemoral Junction: No evidence of thrombus. Normal compressibility and flow on color Doppler imaging. Profunda Femoral Vein: No evidence of thrombus. Normal compressibility and flow on color Doppler imaging. Femoral Vein: There is incompletely obstructing acute thrombus in the distal left femoral vein with sluggish flow. There is compressibility and augmentation in this area. There is diminished Doppler signal in this area. Popliteal Vein: No evidence of thrombus. Normal compressibility, respiratory phasicity and response to augmentation. Calf Veins: No evidence of thrombus. Normal  compressibility and flow on color Doppler imaging. Superficial Great Saphenous Vein: No evidence of thrombus. Normal compressibility and flow on color Doppler imaging. Venous Reflux:  None. Other Findings: There is a complex mass in the left thyroid region measuring 10.7 x 2.4 cm. IMPRESSION: There is evidence of incompletely obstructing deep venous thrombosis, likely acute, in a portion of the distal left femoral vein. No other deep venous thrombosis seen on the left. Right common femoral vein patent. Complex mass in the left calf region. Suspect hematoma, although complex Baker cyst could present in this manner. An infected hematoma or Baker cyst cannot be excluded given this appearance. Clinical assessment advised in this regard. If additional imaging surveillance is felt to be warranted, MRI pre and post-contrast would be the imaging study of choice to further assess this lesion. Electronically Signed   By: Lowella Grip III M.D.   On: 12/29/2016 18:20    ____________________________________________   PROCEDURES  Procedure(s) performed:   Procedures  None ____________________________________________   INITIAL IMPRESSION / ASSESSMENT AND PLAN / ED COURSE  Pertinent labs & imaging results that were available during my care of the patient were reviewed by me and considered in my medical decision making (see chart for details).  Patient presents to the emergency department for evaluation of left calf pain. Ultrasound shows likely acute DVT in the left distal femoral vein.  There is additional finding of possible hematoma in the left calf. Patient is having pain in both locations. Has been on Xarelto in the past with provoked DVT but no provoking factors at this time. No clinical signs/symptoms to raise suspicion for infected hematoma or baker's cyst. No evidence of actively bleeding hematoma. Discussed the risk/benefit of starting Xarelto with calf hematoma when weighed against finding of DVT  in the distal femoral vein.   09:03 PM Labs reviewed with no acute abnormalities. With concern for new, acute DVT will opt to treat the DVT and accept risk of making the calf hematoma worse. Discussed this complicated decision with the patient and he will call his PCP in the AM for close follow up. Discussed return precautions in detail including chest pain or SOB. Patient reports an occasional tingling sensation in the chest but no pain or SOB and symptoms have been ongoing without worsening. No indication for CT or additional imaging (MRI) a this time.   At this time, I do not feel there is any life-threatening condition present. I have reviewed and discussed all results (EKG, imaging, lab, urine as appropriate), exam findings with patient. I have reviewed nursing notes and appropriate previous records.  I feel the patient is safe to be discharged home without further emergent workup. Discussed usual and customary return precautions. Patient and family (if present) verbalize understanding and are comfortable with this plan.  Patient will follow-up with their primary care provider. If they do not have a primary care provider, information for follow-up has been provided to them. All questions have been answered.  ____________________________________________  FINAL CLINICAL IMPRESSION(S) / ED DIAGNOSES  Final diagnoses:  Acute deep vein thrombosis (DVT) of femoral vein of left lower extremity (HCC)  Hematoma     MEDICATIONS GIVEN DURING THIS VISIT:  Medications  Rivaroxaban (XARELTO) tablet 15 mg (15 mg Oral Given 12/29/16 2228)     NEW OUTPATIENT MEDICATIONS STARTED DURING THIS VISIT:  Discharge Medication List as of 12/29/2016  9:12 PM    START taking these medications   Details  Rivaroxaban 15 & 20 MG TBPK Take 15-20 mg by mouth as directed. Take as directed on package: Start with one 15mg  tablet by mouth twice a day with food. On Day 22, switch to one 20mg  tablet once a day with food.,  Starting Tue 12/29/2016, Print        Note:  This document was prepared using Dragon voice recognition software and may include unintentional dictation errors.  Nanda Quinton, MD Emergency Medicine    Long, [facility], MD 12/30/16 [phone]

## 2016-12-29 NOTE — ED Notes (Signed)
Pt ambulatory to waiting room. Pt verbalized understanding of discharge instructions.   

## 2016-12-29 NOTE — ED Triage Notes (Signed)
Pt comes in with left calf swelling and tenderness starting 3 days ago. He has hx of clots and is no longer on a blood thinner.

## 2016-12-29 NOTE — ED Notes (Signed)
Pt explained about the xareltto education packet.

## 2016-12-29 NOTE — Discharge Instructions (Signed)
You were seen in the ED with left leg swelling. We found a new DVT blood clot in the leg as well as a hematoma in the calf. As we discussed, we need to treat the blood clot in the leg vein because it could break off and go to the lungs which can be very dangerous. If you develop any chest pain or difficulty breathing you will need to return to the ED immediately.   You also have a blood clot (hematoma) in the muscle of the left leg. While we need to treat your blood clot in the leg vein (DVT), starting the blood thinner could make the hematoma worse. If you experience sudden worsening pain, swelling, fever, or chills you need to see your PCP or return to the ED immediately.   Call your PCP tomorrow to discuss this ED visit. You can no longer take Naproxen, Aspirin, or Ibuprofen while on Xarelto.

## 2018-06-28 ENCOUNTER — Emergency Department (HOSPITAL_COMMUNITY)
Admission: EM | Admit: 2018-06-28 | Discharge: 2018-06-28 | Disposition: A | Discharge status: Home / Self Care | Payer: No Typology Code available for payment source | Attending: Emergency Medicine | Admitting: Emergency Medicine

## 2018-06-28 ENCOUNTER — Encounter (HOSPITAL_COMMUNITY): Payer: Self-pay | Admitting: Emergency Medicine

## 2018-06-28 ENCOUNTER — Emergency Department (HOSPITAL_COMMUNITY): Payer: No Typology Code available for payment source

## 2018-06-28 ENCOUNTER — Other Ambulatory Visit: Payer: Self-pay

## 2018-06-28 DIAGNOSIS — F1190 Opioid use, unspecified, uncomplicated: Secondary | ICD-10-CM | POA: Insufficient documentation

## 2018-06-28 DIAGNOSIS — Z79891 Long term (current) use of opiate analgesic: Secondary | ICD-10-CM

## 2018-06-28 DIAGNOSIS — R05 Cough: Secondary | ICD-10-CM | POA: Diagnosis present

## 2018-06-28 DIAGNOSIS — Z79899 Other long term (current) drug therapy: Secondary | ICD-10-CM | POA: Insufficient documentation

## 2018-06-28 DIAGNOSIS — I10 Essential (primary) hypertension: Secondary | ICD-10-CM

## 2018-06-28 DIAGNOSIS — R51 Headache: Secondary | ICD-10-CM | POA: Insufficient documentation

## 2018-06-28 DIAGNOSIS — Z7901 Long term (current) use of anticoagulants: Secondary | ICD-10-CM | POA: Insufficient documentation

## 2018-06-28 DIAGNOSIS — Z9114 Patient's other noncompliance with medication regimen: Secondary | ICD-10-CM

## 2018-06-28 DIAGNOSIS — Z9119 Patient's noncompliance with other medical treatment and regimen: Secondary | ICD-10-CM | POA: Insufficient documentation

## 2018-06-28 DIAGNOSIS — F17210 Nicotine dependence, cigarettes, uncomplicated: Secondary | ICD-10-CM | POA: Diagnosis not present

## 2018-06-28 DIAGNOSIS — M79605 Pain in left leg: Secondary | ICD-10-CM | POA: Insufficient documentation

## 2018-06-28 DIAGNOSIS — M79662 Pain in left lower leg: Secondary | ICD-10-CM

## 2018-06-28 DIAGNOSIS — R519 Headache, unspecified: Secondary | ICD-10-CM

## 2018-06-28 HISTORY — DX: Pain in unspecified hip: M25.559

## 2018-06-28 HISTORY — DX: Chronic viral hepatitis C: B18.2

## 2018-06-28 HISTORY — DX: Essential (primary) hypertension: I10

## 2018-06-28 HISTORY — DX: Acute embolism and thrombosis of unspecified deep veins of unspecified lower extremity: I82.409

## 2018-06-28 HISTORY — DX: Other chronic pain: G89.29

## 2018-06-28 HISTORY — DX: Anemia, unspecified: D64.9

## 2018-06-28 HISTORY — DX: Cervical disc disorder with radiculopathy, unspecified cervical region: M50.10

## 2018-06-28 HISTORY — DX: Pain in unspecified shoulder: M25.519

## 2018-06-28 LAB — CBC WITH DIFFERENTIAL/PLATELET
Abs Immature Granulocytes: 0.02 10*3/uL (ref 0.00–0.07)
Basophils Absolute: 0 10*3/uL (ref 0.0–0.1)
Basophils Relative: 1 %
Eosinophils Absolute: 0 10*3/uL (ref 0.0–0.5)
Eosinophils Relative: 0 %
HCT: 39.9 % (ref 39.0–52.0)
HEMOGLOBIN: 12.6 g/dL — AB (ref 13.0–17.0)
Immature Granulocytes: 0 %
LYMPHS ABS: 1.3 10*3/uL (ref 0.7–4.0)
LYMPHS PCT: 21 %
MCH: 29.5 pg (ref 26.0–34.0)
MCHC: 31.6 g/dL (ref 30.0–36.0)
MCV: 93.4 fL (ref 80.0–100.0)
Monocytes Absolute: 0.7 10*3/uL (ref 0.1–1.0)
Monocytes Relative: 12 %
NRBC: 0 % (ref 0.0–0.2)
Neutro Abs: 4 10*3/uL (ref 1.7–7.7)
Neutrophils Relative %: 66 %
Platelets: 310 10*3/uL (ref 150–400)
RBC: 4.27 MIL/uL (ref 4.22–5.81)
RDW: 14.8 % (ref 11.5–15.5)
WBC: 6.1 10*3/uL (ref 4.0–10.5)

## 2018-06-28 LAB — URINALYSIS, ROUTINE W REFLEX MICROSCOPIC
Bilirubin Urine: NEGATIVE
Glucose, UA: NEGATIVE mg/dL
Hgb urine dipstick: NEGATIVE
Ketones, ur: NEGATIVE mg/dL
LEUKOCYTE UA: NEGATIVE
Nitrite: NEGATIVE
PROTEIN: NEGATIVE mg/dL
Specific Gravity, Urine: 1.009 (ref 1.005–1.030)
pH: 8 (ref 5.0–8.0)

## 2018-06-28 LAB — COMPREHENSIVE METABOLIC PANEL
ALT: 18 U/L (ref 0–44)
ANION GAP: 10 (ref 5–15)
AST: 27 U/L (ref 15–41)
Albumin: 4.2 g/dL (ref 3.5–5.0)
Alkaline Phosphatase: 73 U/L (ref 38–126)
BUN: 6 mg/dL — ABNORMAL LOW (ref 8–23)
CO2: 25 mmol/L (ref 22–32)
Calcium: 9.3 mg/dL (ref 8.9–10.3)
Chloride: 92 mmol/L — ABNORMAL LOW (ref 98–111)
Creatinine, Ser: 0.56 mg/dL — ABNORMAL LOW (ref 0.61–1.24)
GFR calc Af Amer: >60 mL/min (ref >60)
Glucose, Bld: 107 mg/dL — ABNORMAL HIGH (ref 70–99)
Potassium: 4.1 mmol/L (ref 3.5–5.1)
Sodium: 127 mmol/L — ABNORMAL LOW (ref 135–145)
Total Bilirubin: 0.4 mg/dL (ref 0.3–1.2)
Total Protein: 7.9 g/dL (ref 6.5–8.1)

## 2018-06-28 LAB — SEDIMENTATION RATE: SED RATE: 57 mm/h — AB (ref 0–16)

## 2018-06-28 LAB — TROPONIN I

## 2018-06-28 MED ORDER — AMLODIPINE BESYLATE 5 MG PO TABS: 5.0000 mg | ORAL_TABLET | Freq: Every day | ORAL | 0 refills | Status: AC

## 2018-06-28 MED ORDER — SODIUM CHLORIDE 0.9 % IV SOLN
INTRAVENOUS | Status: DC
  Administered 2018-06-28: 11:00:00 via INTRAVENOUS

## 2018-06-28 MED ORDER — SODIUM CHLORIDE 0.9 % IV BOLUS
500.0000 mL | Freq: Once | INTRAVENOUS | Status: AC
  Administered 2018-06-28: 500 mL via INTRAVENOUS

## 2018-06-28 MED ORDER — OXYCODONE HCL 5 MG PO TABS
5.0000 mg | ORAL_TABLET | Freq: Once | ORAL | Status: AC
  Administered 2018-06-28: 5 mg via ORAL
  Filled 2018-06-28: qty 1

## 2018-06-28 MED ORDER — LABETALOL HCL 5 MG/ML IV SOLN
10.0000 mg | Freq: Once | INTRAVENOUS | Status: AC
  Administered 2018-06-28: 10 mg via INTRAVENOUS
  Filled 2018-06-28: qty 4

## 2018-06-28 MED ORDER — OXYCODONE-ACETAMINOPHEN 5-325 MG PO TABS: 1.0000 | ORAL_TABLET | Freq: Once | ORAL | Status: DC

## 2018-06-28 NOTE — ED Triage Notes (Signed)
Headache and swelling to lt thigh x 2 weeks

## 2018-06-28 NOTE — Discharge Instructions (Signed)
Take the new prescription as directed. Your sodium level was low today, and you will need this re-checked by your regular medical doctor. Restrict your fluid intake to less than 2 Liters per day until you are seen in follow up.  Call your regular medical doctor today to schedule a follow up appointment within the next 3 days.  Return to the Emergency Department immediately sooner if worsening.

## 2018-06-28 NOTE — Consult Note (Signed)
Medical Consultation   Jimmy Holland  WUX:[id number]  DOB: Feb 06, 1956  DOA: 06/28/2018  PCP: John Giovanni, MD  Requesting physician: Dr. Thurnell Garbe  Reason for consultation: Hypertension, hyponatremia-hospital admission  History of Present Illness: Jimmy Holland is an 63 y.o. 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  He denies any fevers or chills and states that he was told to come off of his previous blood pressure medications after being seen at the [facility].  He now follows up with a PCP at the [facility] and has been off blood pressure medications for an unknown period of time.  He continues to take his Xarelto, however on a daily basis as well as a few other pain medications.  He was noted to present with a significantly elevated blood pressure reading of 185/112 and has received a dose of 10 mg IV labetalol with significant improvement in his headache and blood pressure readings.  He has had imaging studies to include left lower extremity Doppler ultrasound with no findings of DVT and CT head with no acute findings.  He is also had a two-view chest x-ray as well as ankle study with no findings.  Laboratory data only remarkable for a sodium of 127.  Review of Systems:  All others reviewed and otherwise negative.  Past Medical History: Past Medical History:  Diagnosis Date  . Anemia   . Arthritis   . Cervical disc disorder with radiculopathy   . 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 Chronic hip pain   . Chronic pain   . Chronic shoulder pain   . DVT (deep venous thrombosis) ([place])   . History of blood clots   . Hypertension     Past Surgical History: Past Surgical History:  Procedure Laterality Date  . HIP SURGERY    . NECK SURGERY       Allergies:  No Known Allergies   Social History:  reports  that he has been smoking cigarettes. He has been smoking about 0.50 packs per day. He has never used smokeless tobacco. He reports current alcohol use of about 14.0 standard drinks of alcohol per week. He reports that he does not use drugs.   Family History: No family history on file.  Physical Exam: Vitals:   06/28/18 1115 06/28/18 1130 06/28/18 1145 06/28/18 1200  BP:  120/83  115/90  Pulse: 79 80 78 74  Resp: 18 (!) 22 (!) 21 20  Temp:      TempSrc:      SpO2: 97% 96% 97% 97%  Weight:      Height:        Constitutional:Alert and awake, oriented x3, not in any acute distress. Eyes: PERLA, EOMI, irises appear normal, anicteric sclera,  ENMT: external ears and nose appear normal            Lips appears normal, oropharynx mucosa, tongue, posterior pharynx appear normal  Neck: neck appears normal, no masses, normal ROM, no thyromegaly, no JVD  CVS: S1-S2 clear, no murmur rubs or gallops, no LE edema, normal pedal pulses  Respiratory:  clear to auscultation bilaterally, no wheezing, rales or rhonchi. Respiratory effort normal. No accessory muscle use.  Abdomen: soft nontender, nondistended, normal bowel sounds, no hepatosplenomegaly, no hernias  Musculoskeletal: : no cyanosis, clubbing or edema noted bilaterally Neuro: Cranial nerves II-XII intact, strength, sensation, reflexes Psych: judgement and insight appear normal, stable mood and affect, mental status Skin: no rashes or lesions or ulcers, no induration or nodules   Data reviewed:  I have personally reviewed following labs and imaging studies Labs:  CBC: Recent Labs  Lab 06/28/18 0739  WBC 6.1  NEUTROABS 4.0  HGB 12.6*  HCT 39.9  MCV 93.4  PLT 194    Basic Metabolic Panel: Recent Labs  Lab 06/28/18 0748  NA 127*  K 4.1  CL 92*  CO2 25  GLUCOSE 107*  BUN 6*  CREATININE 0.56*  CALCIUM 9.3   GFR Estimated Creatinine Clearance: 89.7 mL/min (A) (by C-G formula based on SCr of 0.56 mg/dL (L)). Liver Function  Tests: Recent Labs  Lab 06/28/18 0748  AST 27  ALT 18  ALKPHOS 73  BILITOT 0.4  PROT 7.9  ALBUMIN 4.2   No results for input(s): LIPASE, AMYLASE in the last 168 hours. No results for input(s): AMMONIA in the last 168 hours. Coagulation profile No results for input(s): INR, PROTIME in the last 168 hours.  Cardiac Enzymes: Recent Labs  Lab 06/28/18 0739  TROPONINI <0.03   BNP: Invalid input(s): POCBNP CBG: No results for input(s): GLUCAP in the last 168 hours. D-Dimer No results for input(s): DDIMER in the last 72 hours. Hgb A1c No results for input(s): HGBA1C in the last 72 hours. Lipid Profile No results for input(s): CHOL, HDL, LDLCALC, TRIG, CHOLHDL, LDLDIRECT in the last 72 hours. Thyroid function studies No results for input(s): TSH, T4TOTAL, T3FREE, THYROIDAB in the last 72 hours.  Invalid input(s): FREET3 Anemia work up No results for input(s): VITAMINB12, FOLATE, FERRITIN, TIBC, IRON, RETICCTPCT in the last 72 hours. Urinalysis    Component Value Date/Time   COLORURINE STRAW (A) 06/28/2018 0732   APPEARANCEUR CLEAR 06/28/2018 0732   LABSPEC 1.009 06/28/2018 0732   PHURINE 8.0 06/28/2018 0732   GLUCOSEU NEGATIVE 06/28/2018 0732   HGBUR NEGATIVE 06/28/2018 0732   BILIRUBINUR NEGATIVE 06/28/2018 0732   KETONESUR NEGATIVE 06/28/2018 0732   PROTEINUR NEGATIVE 06/28/2018 0732   NITRITE NEGATIVE 06/28/2018 0732   LEUKOCYTESUR NEGATIVE 06/28/2018 0732     Microbiology No results found for this or any previous visit (from the past 240 hour(s)).     Inpatient Medications:   Scheduled Meds: Continuous Infusions: . sodium chloride 75 mL/hr at 06/28/18 1040     Radiological Exams on Admission: Dg Chest 2 View  Result Date: 06/28/2018 CLINICAL DATA:  Headache with left knee and ankle swelling for 4 days. EXAM: CHEST - 2 VIEW COMPARISON:  11/04/2007 FINDINGS: Cardiac silhouette is normal in size. No mediastinal or hilar masses. No evidence of adenopathy.  Chronic bronchitic changes in the lower lobes. Lungs are otherwise clear. No pleural effusion or pneumothorax. Skeletal structures are intact. IMPRESSION: No active cardiopulmonary disease. Electronically Signed   By: Lajean Manes M.D.   On: 06/28/2018 08:50   Dg Ankle Complete Left  Result Date: 06/28/2018 CLINICAL DATA:  Left knee and ankle swelling for 4 days. EXAM: LEFT ANKLE COMPLETE - 3+ VIEW COMPARISON:  None. FINDINGS: No acute fracture.  No bone lesion. There is mature ossification along the intraosseous ligament between distal tibia and fibula, likely due to remote trauma. Ankle joint is normally spaced and aligned. No significant arthropathic changes. Small plantar calcaneal spur. Soft tissues are unremarkable. IMPRESSION: 1. No acute fracture or bone lesion. 2. Chronic calcification  of the interosseous membrane between the distal tibia and fibula consistent with a remote injury. 3. No significant arthropathic changes of the tibiotalar joint. 4. Small plantar calcaneal spur. Electronically Signed   By: Lajean Manes M.D.   On: 06/28/2018 08:52   Ct Head Wo Contrast  Result Date: 06/28/2018 CLINICAL DATA:  Left-sided headache, no trauma, left-sided blurry vision EXAM: CT HEAD WITHOUT CONTRAST TECHNIQUE: Contiguous axial images were obtained from the base of the skull through the vertex without intravenous contrast. COMPARISON:  None. FINDINGS: Brain: No evidence of acute infarction, hemorrhage, hydrocephalus, extra-axial collection or mass lesion/mass effect. Mild periventricular white matter hypodensity. Vascular: No hyperdense vessel or unexpected calcification. Skull: Normal. Negative for fracture or focal lesion. Sinuses/Orbits: No acute finding. Other: None. IMPRESSION: No acute intracranial pathology. No non-contrast CT findings to explain headache. Small-vessel white matter disease. Electronically Signed   By: Eddie Candle M.D.   On: 06/28/2018 08:55   US Venous Img Lower  Left (dvt  Study)  Result Date: 06/28/2018 CLINICAL DATA:  63 year old with left thigh edema. EXAM: LEFT LOWER EXTREMITY VENOUS DOPPLER ULTRASOUND TECHNIQUE: Gray-scale sonography with graded compression, as well as color Doppler and duplex ultrasound were performed to evaluate the lower extremity deep venous systems from the level of the common femoral vein and including the common femoral, femoral, profunda femoral, popliteal and calf veins including the posterior tibial, peroneal and gastrocnemius veins when visible. The superficial great saphenous vein was also interrogated. Spectral Doppler was utilized to evaluate flow at rest and with distal augmentation maneuvers in the common femoral, femoral and popliteal veins. COMPARISON:  12/29/2016 FINDINGS: Contralateral Common Femoral Vein: Respiratory phasicity is normal and symmetric with the symptomatic side. No evidence of thrombus. Normal compressibility. Common Femoral Vein: No evidence of thrombus. Normal compressibility, respiratory phasicity and response to augmentation. Saphenofemoral Junction: No evidence of thrombus. Normal compressibility and flow on color Doppler imaging. Profunda Femoral Vein: No evidence of thrombus. Normal compressibility and flow on color Doppler imaging. Femoral Vein: No evidence of thrombus. Normal compressibility, respiratory phasicity and response to augmentation. Popliteal Vein: No evidence of thrombus. Normal compressibility, respiratory phasicity and response to augmentation. Calf Veins: No evidence of thrombus. Normal compressibility and flow on color Doppler imaging. Superficial Great Saphenous Vein: No evidence of thrombus. Normal compressibility. Other Findings:  None. IMPRESSION: Negative for deep venous thrombosis in left lower extremity. Electronically Signed   By: Markus Daft M.D.   On: 06/28/2018 09:51   Dg Knee Complete 4 Views Left  Result Date: 06/28/2018 CLINICAL DATA:  Headache with left knee and ankle swelling x 4 days  EXAM: LEFT KNEE - COMPLETE 4+ VIEW COMPARISON:  None. FINDINGS: No fracture or bone lesion. Knee joint normally spaced and aligned. No arthropathic changes. No joint effusion. There are scattered arterial vascular calcifications posteriorly. Soft tissues are otherwise unremarkable. IMPRESSION: No fracture, bone lesion or joint abnormality. Electronically Signed   By: Lajean Manes M.D.   On: 06/28/2018 08:54    Impression/Recommendations Active Problems:   * No active hospital problems. *  1. Accelerated hypertension-resolved 2. Mild hyponatremia-asymptomatic 3. Venous thromboembolism history on Xarelto  Patient is responded well to IV labetalol push and has had improvement with his headache noted as well.  Would recommend discharged with amlodipine 5 mg daily and close follow-up in the next 1 to 2 weeks with his PCP at the [facility].  Will also need repeat BMP at that time to reassess sodium levels.  He does not need  hospitalization at this time as he is otherwise asymptomatic.  Recommend fluid restriction of less than 2 L of intake daily as well.  Thank you for this consultation.    Time Spent: 30 minutes  Maryagnes Carrasco D Mekayla Soman DO [facility] 06/28/2018, 12:35 PM

## 2018-06-28 NOTE — ED Provider Notes (Signed)
[facility] EMERGENCY DEPARTMENT Provider Note   CSN: [id number] Arrival date & time: 06/28/18  0626    History   Chief Complaint Chief Complaint  Patient presents with  . Headache    HPI Jimmy Holland is a 63 y.o. male.      Headache   Pt was seen at 0720. Per pt, c/o gradual onset and persistence of multiple complaints for the past 2 weeks. Complaints include: cough, left leg "pain" and "swelling" and left frontal headache. Pt states his left ankle to thigh areas "hurt" and he is concerned regarding "having another blood clot in it." Endorses compliance with xarelto. Pt also states his left forehead "aches," he has been taking tylenol which improves the discomfort, and then it returns. LD APAP yesterday. Pt apparently has run out of his chronic narcotic pain medications, pt is unclear if his "pains" began after this. Pt states he has not taken his BP meds "in a while" because "[facility] and then they didn't prescribe them anymore." Unclear when this occurred. Denies CP/palpitations, no SOB/cough, no abd pain, no N/V/D, no fevers, no rash, no injury, no eye pain, no visual changes, no focal motor weakness, no tingling/numbness in extremities, no ataxia, no slurred speech, no facial droop.    Past Medical History:  Diagnosis Date  . Anemia   . Arthritis   . Cervical disc disorder with radiculopathy   . Chronic hepatitis C (HCC)   . Chronic hip pain   . Chronic pain   . Chronic shoulder pain   . DVT (deep venous thrombosis) (HCC)   . History of blood clots   . Hypertension     Patient Active Problem List   Diagnosis Date Noted  . Thrombocytosis (HCC) 09/28/2013  . Rheumatoid arthritis(714.0) 09/21/2013  . Avascular necrosis of bone of left hip (HCC) 09/21/2013  . Cervical disc disorder with radiculopathy of cervical region 09/21/2013  . Hepatitis C 09/21/2013  . Acute blood loss anemia 09/21/2013  . Pain in joint, shoulder region 03/21/2012  . Muscle weakness  (generalized) 03/21/2012    Past Surgical History:  Procedure Laterality Date  . HIP SURGERY    . NECK SURGERY          Home Medications    Prior to Admission medications   Medication Sig Start Date End Date Taking? Authorizing Provider  docusate sodium (COLACE) 100 MG capsule Take 100 mg by mouth 2 (two) times daily.    [provider]  gabapentin (NEURONTIN) 300 MG capsule Take 600 mg by mouth 3 (three) times daily.     [provider]  oxyCODONE (ROXICODONE) 5 MG immediate release tablet Take one to three tablets by mouth every 3 hours as needed for pain 09/19/13   Green, Arthur G, MD  pantoprazole (PROTONIX) 40 MG tablet Take 40 mg by mouth daily.    [provider]  predniSONE (DELTASONE) 5 MG tablet Take 5 mg by mouth daily with breakfast.    [provider]  Rivaroxaban 15 & 20 MG TBPK Take 15-20 mg by mouth as directed. Take as directed on package: Start with one 15mg tablet by mouth twice a day with food. On Day 22, switch to one 20mg tablet once a day with food. 12/29/16   Long, Joshua G, MD    Family History No family history on file.  Social History Social History   Tobacco Use  . Smoking status: Current Every Day Smoker    Packs/day: 0.50      Types: Cigarettes  . Smokeless tobacco: Never Used  Substance Use Topics  . Alcohol use: Yes    Alcohol/week: 14.0 standard drinks    Types: 14 Cans of beer per week  . Drug use: No     Allergies   Patient has no known allergies.   Review of Systems Review of Systems  Neurological: Positive for headaches.   ROS: Statement: All systems negative except as marked or noted in the HPI; Constitutional: Negative for fever and chills. ; ; Eyes: Negative for eye pain, redness and discharge. ; ; ENMT: Negative for ear pain, hoarseness, nasal congestion, sinus pressure and sore throat. ; ; Cardiovascular: Negative for chest pain, palpitations, diaphoresis, dyspnea and peripheral edema. ; ;  Respiratory: Negative for cough, wheezing and stridor. ; ; Gastrointestinal: Negative for nausea, vomiting, diarrhea, abdominal pain, blood in stool, hematemesis, jaundice and rectal bleeding. . ; ; Genitourinary: Negative for dysuria, flank pain and hematuria. ; ; Musculoskeletal: +left leg pain/swelling. Negative for back pain and neck pain. Negative for deformity and trauma.; ; Skin: Negative for pruritus, rash, abrasions, blisters, bruising and skin lesion.; ; Neuro: +headache. Negative for lightheadedness and neck stiffness. Negative for weakness, altered level of consciousness, altered mental status, extremity weakness, paresthesias, involuntary movement, seizure and syncope.       Physical Exam Updated Vital Signs BP (!) 185/142   Pulse 91   Temp (!) 97.5 F (36.4 C) (Oral)   Resp 19   Ht 6' (1.829 m)   Wt 68 kg   SpO2 97%   BMI 20.33 kg/m    Patient Vitals for the past 24 hrs:  BP Temp Temp src Pulse Resp SpO2 Height Weight  06/28/18 1200 115/90 - - 74 20 97 % - -  06/28/18 1145 - - - 78 (!) 21 97 % - -  06/28/18 1130 120/83 - - 80 (!) 22 96 % - -  06/28/18 1115 - - - 79 18 97 % - -  06/28/18 1100 (!) 148/97 - - 78 (!) 24 99 % - -  06/28/18 1034 (!) 139/93 - - 84 10 96 % - -  06/28/18 0742 - - - 91 19 97 % - -  06/28/18 0739 (!) 185/142 - - - - - - -  06/28/18 0725 (!) 191/134 (!) 97.5 F (36.4 C) Oral 88 18 98 % - -  06/28/18 0723 - - - - - - 6' (1.829 m) 68 kg      Physical Exam 0825: Physical examination:  Nursing notes reviewed; Vital signs and O2 SAT reviewed;  Constitutional: Well developed, Well nourished, Well hydrated, In no acute distress; Head:  Normocephalic, atraumatic. NT over temporal arteries and mastoid processes bilat. No palp cords, no rash..; Eyes: EOMI, PERRL, No scleral icterus; ENMT: TM's clear bilat. +edemetous nasal turbinates bilat with clear rhinorrhea. Mouth and pharynx without lesions. No tonsillar exudates. No intra-oral edema. No  submandibular or sublingual edema. No hoarse voice, no drooling, no stridor. No pain with manipulation of larynx. No trismus. Mouth and pharynx normal, Mucous membranes moist; Neck: Supple, Full range of motion, No lymphadenopathy; Cardiovascular: Regular rate and rhythm, No gallop; Respiratory: Breath sounds clear & equal bilaterally, [place].  Speaking full sentences with ease, Normal respiratory effort/excursion; Chest: Nontender, Movement normal; Abdomen: Soft, Nontender, Nondistended, Normal bowel sounds; Genitourinary: No CVA tenderness; Extremities: Peripheral pulses normal, No deformity. NT left hip/knee/ankle. No edema, No calf edema or asymmetry.; Neuro: AA&Ox3, No facial droop. Major CN grossly intact.    Speech clear. No gross focal motor or sensory deficits in extremities.; Skin: Color normal, Warm, Dry.   ED Treatments / Results  Labs (all labs ordered are listed, but only abnormal results are displayed)   EKG EKG Interpretation  Date/Time:  Tuesday June 28 2018 07:41:42 EST Ventricular Rate:  88 PR Interval:    QRS Duration: 84 QT Interval:  386 QTC Calculation: 467 R Axis:   59 Text Interpretation:  Sinus rhythm Probable left atrial enlargement Abnormal R-wave progression, early transition When compared with ECG of 11/04/2007 No significant change was found Confirmed by McManus, Kathleen ([id number]) on 06/28/2018 8:44:08 AM   Radiology   Procedures Procedures (including critical care time)  Medications Ordered in ED Medications  oxyCODONE (Oxy IR/ROXICODONE) immediate release tablet 5 mg (5 mg Oral Given 06/28/18 0739)     Initial Impression / Assessment and Plan / ED Course  I have reviewed the triage vital signs and the nursing notes.  Pertinent labs & imaging results that were available during my care of the patient were reviewed by me and considered in my medical decision making (see chart for details).     MDM Reviewed: previous chart, nursing note and  vitals Reviewed previous: labs and ECG Interpretation: labs, ECG, x-ray, CT scan and ultrasound    Results for orders placed or performed during the hospital encounter of 06/28/18  CBC with Differential  Result Value Ref Range   WBC 6.1 4.0 - 10.5 K/uL   RBC 4.27 4.22 - 5.81 MIL/uL   Hemoglobin 12.6 (L) 13.0 - 17.0 g/dL   HCT 39.9 39.0 - 52.0 %   MCV 93.4 80.0 - 100.0 fL   MCH 29.5 26.0 - 34.0 pg   MCHC 31.6 30.0 - 36.0 g/dL   RDW 14.8 11.5 - 15.5 %   Platelets 310 150 - 400 K/uL   nRBC 0.0 0.0 - 0.2 %   Neutrophils Relative % 66 %   Neutro Abs 4.0 1.7 - 7.7 K/uL   Lymphocytes Relative 21 %   Lymphs Abs 1.3 0.7 - 4.0 K/uL   Monocytes Relative 12 %   Monocytes Absolute 0.7 0.1 - 1.0 K/uL   Eosinophils Relative 0 %   Eosinophils Absolute 0.0 0.0 - 0.5 K/uL   Basophils Relative 1 %   Basophils Absolute 0.0 0.0 - 0.1 K/uL   Immature Granulocytes 0 %   Abs Immature Granulocytes 0.02 0.00 - 0.07 K/uL  Sedimentation rate  Result Value Ref Range   Sed Rate 57 (H) 0 - 16 mm/hr  Troponin I - Once  Result Value Ref Range   Troponin I <0.03 <0.03 ng/mL  Urinalysis, Routine w reflex microscopic  Result Value Ref Range   Color, Urine STRAW (A) YELLOW   APPearance CLEAR CLEAR   Specific Gravity, Urine 1.009 1.005 - 1.030   pH 8.0 5.0 - 8.0   Glucose, UA NEGATIVE NEGATIVE mg/dL   Hgb urine dipstick NEGATIVE NEGATIVE   Bilirubin Urine NEGATIVE NEGATIVE   Ketones, ur NEGATIVE NEGATIVE mg/dL   Protein, ur NEGATIVE NEGATIVE mg/dL   Nitrite NEGATIVE NEGATIVE   Leukocytes,Ua NEGATIVE NEGATIVE  Comprehensive metabolic panel  Result Value Ref Range   Sodium 127 (L) 135 - 145 mmol/L   Potassium 4.1 3.5 - 5.1 mmol/L   Chloride 92 (L) 98 - 111 mmol/L   CO2 25 22 - 32 mmol/L   Glucose, Bld 107 (H) 70 - 99 mg/dL   BUN 6 (L) 8 - 23 mg/dL   Creatinine,   Ser 0.56 (L) 0.61 - 1.24 mg/dL   Calcium 9.3 8.9 - 10.3 mg/dL   Total Protein 7.9 6.5 - 8.1 g/dL   Albumin 4.2 3.5 - 5.0 g/dL   AST 27  15 - 41 U/L   ALT 18 0 - 44 U/L   Alkaline Phosphatase 73 38 - 126 U/L   Total Bilirubin 0.4 0.3 - 1.2 mg/dL   GFR calc non Af Amer >60 >60 mL/min   GFR calc Af Amer >60 >60 mL/min   Anion gap 10 5 - 15   Dg Chest 2 View Result Date: 06/28/2018 CLINICAL DATA:  Headache with left knee and ankle swelling for 4 days. EXAM: CHEST - 2 VIEW COMPARISON:  11/04/2007 FINDINGS: Cardiac silhouette is normal in size. No mediastinal or hilar masses. No evidence of adenopathy. Chronic bronchitic changes in the lower lobes. Lungs are otherwise clear. No pleural effusion or pneumothorax. Skeletal structures are intact. IMPRESSION: No active cardiopulmonary disease. Electronically Signed   By: David  Ormond M.D.   On: 06/28/2018 08:50   Dg Ankle Complete Left Result Date: 06/28/2018 CLINICAL DATA:  Left knee and ankle swelling for 4 days. EXAM: LEFT ANKLE COMPLETE - 3+ VIEW COMPARISON:  None. FINDINGS: No acute fracture.  No bone lesion. There is mature ossification along the intraosseous ligament between distal tibia and fibula, likely due to remote trauma. Ankle joint is normally spaced and aligned. No significant arthropathic changes. Small plantar calcaneal spur. Soft tissues are unremarkable. IMPRESSION: 1. No acute fracture or bone lesion. 2. Chronic calcification of the interosseous membrane between the distal tibia and fibula consistent with a remote injury. 3. No significant arthropathic changes of the tibiotalar joint. 4. Small plantar calcaneal spur. Electronically Signed   By: David  Ormond M.D.   On: 06/28/2018 08:52   Ct Head Wo Contrast Result Date: 06/28/2018 CLINICAL DATA:  Left-sided headache, no trauma, left-sided blurry vision EXAM: CT HEAD WITHOUT CONTRAST TECHNIQUE: Contiguous axial images were obtained from the base of the skull through the vertex without intravenous contrast. COMPARISON:  None. FINDINGS: Brain: No evidence of acute infarction, hemorrhage, hydrocephalus, extra-axial collection or  mass lesion/mass effect. Mild periventricular white matter hypodensity. Vascular: No hyperdense vessel or unexpected calcification. Skull: Normal. Negative for fracture or focal lesion. Sinuses/Orbits: No acute finding. Other: None. IMPRESSION: No acute intracranial pathology. No non-contrast CT findings to explain headache. Small-vessel white matter disease. Electronically Signed   By: Alex  Bibbey M.D.   On: 06/28/2018 08:55   Us Venous Img Lower  Left (dvt Study) Result Date: 06/28/2018 CLINICAL DATA:  62-year-old with left thigh edema. EXAM: LEFT LOWER EXTREMITY VENOUS DOPPLER ULTRASOUND TECHNIQUE: Gray-scale sonography with graded compression, as well as color Doppler and duplex ultrasound were performed to evaluate the lower extremity deep venous systems from the level of the common femoral vein and including the common femoral, femoral, profunda femoral, popliteal and calf veins including the posterior tibial, peroneal and gastrocnemius veins when visible. The superficial great saphenous vein was also interrogated. Spectral Doppler was utilized to evaluate flow at rest and with distal augmentation maneuvers in the common femoral, femoral and popliteal veins. COMPARISON:  12/29/2016 FINDINGS: Contralateral Common Femoral Vein: Respiratory phasicity is normal and symmetric with the symptomatic side. No evidence of thrombus. Normal compressibility. Common Femoral Vein: No evidence of thrombus. Normal compressibility, respiratory phasicity and response to augmentation. Saphenofemoral Junction: No evidence of thrombus. Normal compressibility and flow on color Doppler imaging. Profunda Femoral Vein: No evidence of thrombus. Normal compressibility and   flow on color Doppler imaging. Femoral Vein: No evidence of thrombus. Normal compressibility, respiratory phasicity and response to augmentation. Popliteal Vein: No evidence of thrombus. Normal compressibility, respiratory phasicity and response to augmentation. Calf  Veins: No evidence of thrombus. Normal compressibility and flow on color Doppler imaging. Superficial Great Saphenous Vein: No evidence of thrombus. Normal compressibility. Other Findings:  None. IMPRESSION: Negative for deep venous thrombosis in left lower extremity. Electronically Signed   By: Markus Daft M.D.   On: 06/28/2018 09:51   Dg Knee Complete 4 Views Left Result Date: 06/28/2018 CLINICAL DATA:  Headache with left knee and ankle swelling x 4 days EXAM: LEFT KNEE - COMPLETE 4+ VIEW COMPARISON:  None. FINDINGS: No fracture or bone lesion. Knee joint normally spaced and aligned. No arthropathic changes. No joint effusion. There are scattered arterial vascular calcifications posteriorly. Soft tissues are otherwise unremarkable. IMPRESSION: No fracture, bone lesion or joint abnormality. Electronically Signed   By: Lajean Manes M.D.   On: 06/28/2018 08:54    1015:  Feels better after meds. Has tol PO well without N/V. Neuro exam remains intact/unchanged. ESR mildly elevated, most likely due to known rheumatoid arthritis; doubt temporal arteritis.  BP elevated, new mild hyponatremia: Will dose IVF NS bolus and IV labetalol. Pt denies CP/SOB. Pt with medication non-compliance. T/C returned from [facility] Dr. Manuella Ghazi, case discussed, including:  HPI, pertinent PM/SHx, VS/PE, dx testing, ED course and treatment:  Agreeable to come to ED for evaluation, OK to dose judicious IVF NS bolus and IV labetalol now.   1245:  [facility] Dr. Manuella Ghazi has evaluated pt in ED (see consult note): BP has improved, pt's neuro exam remains intact/unchanged and he is not symptomatic from mild hyponatremia, no clear indication for admission at this time, recommends amlodipine 44m PO daily, [facility]. Pt states he is ready to go home now and is agreeable with this plan.       Final Clinical Impressions(s) / ED Diagnoses   Final diagnoses:  None    ED Discharge Orders    None       MFrancine Graven DO 07/02/18 1745

## 2018-06-28 NOTE — Progress Notes (Signed)
EDP called regarding admission for this patient. He has not yet been given labetalol for his hypertension, and therefore, I will await response prior to considering admission. Will evaluate patient in ED and make further recommendations shortly.

## 2018-10-19 ENCOUNTER — Other Ambulatory Visit: Payer: Self-pay | Admitting: Internal Medicine

## 2018-10-19 ENCOUNTER — Other Ambulatory Visit: Payer: Self-pay

## 2018-10-19 DIAGNOSIS — Z20822 Contact with and (suspected) exposure to covid-19: Secondary | ICD-10-CM

## 2018-10-19 NOTE — Progress Notes (Unsigned)
[id number] 

## 2018-10-24 LAB — NOVEL CORONAVIRUS, NAA: SARS-CoV-2, NAA: NOT DETECTED

## 2018-11-26 DEATH — deceased

## 2020-03-22 IMAGING — DX DG CHEST 2V
2 series · 2 of 2 positions shown · non-contrast
Comparison: 11/04/2007

CLINICAL DATA: Headache with left knee and ankle swelling for 4
days.

EXAM:
CHEST - 2 VIEW

[chest pa]
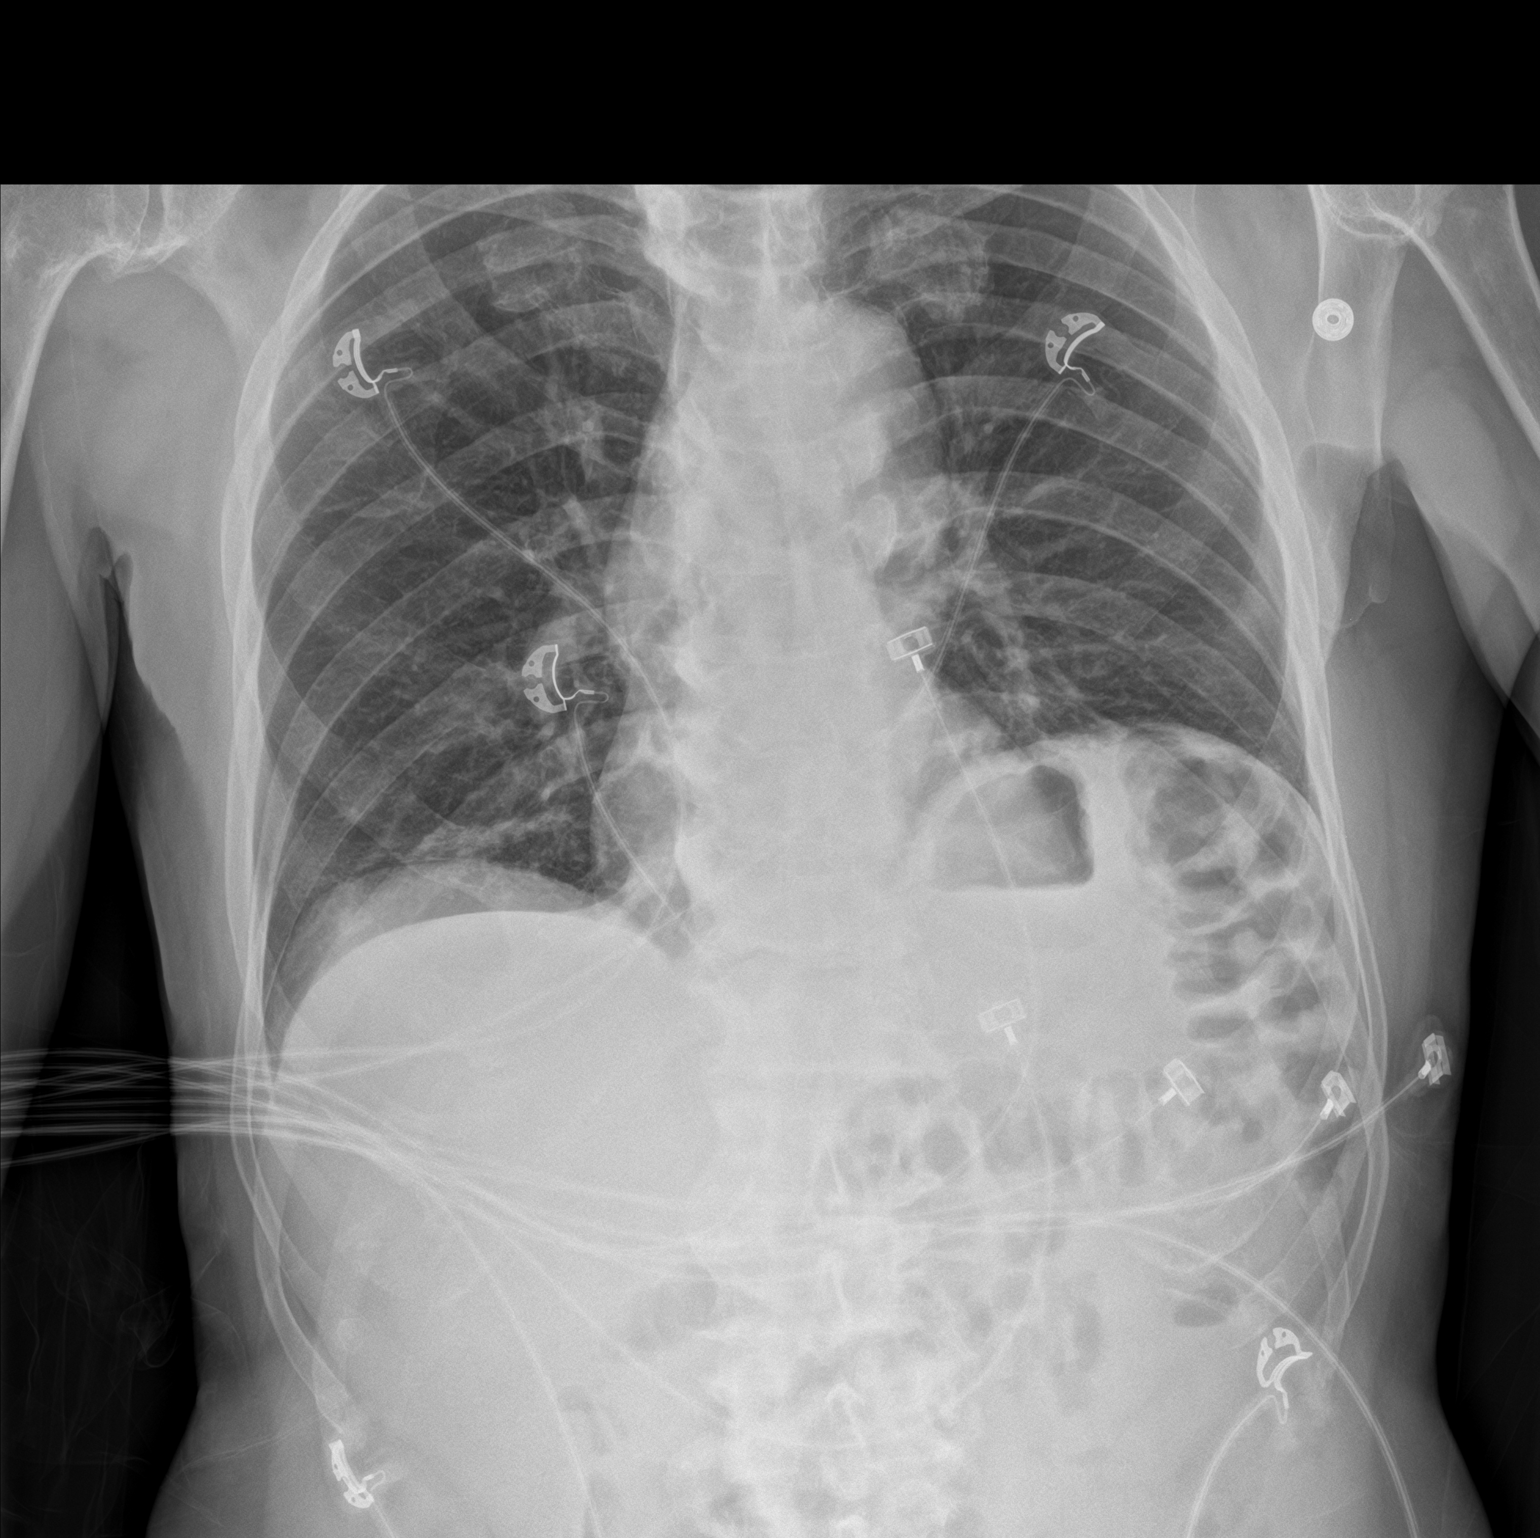

[chest lat]
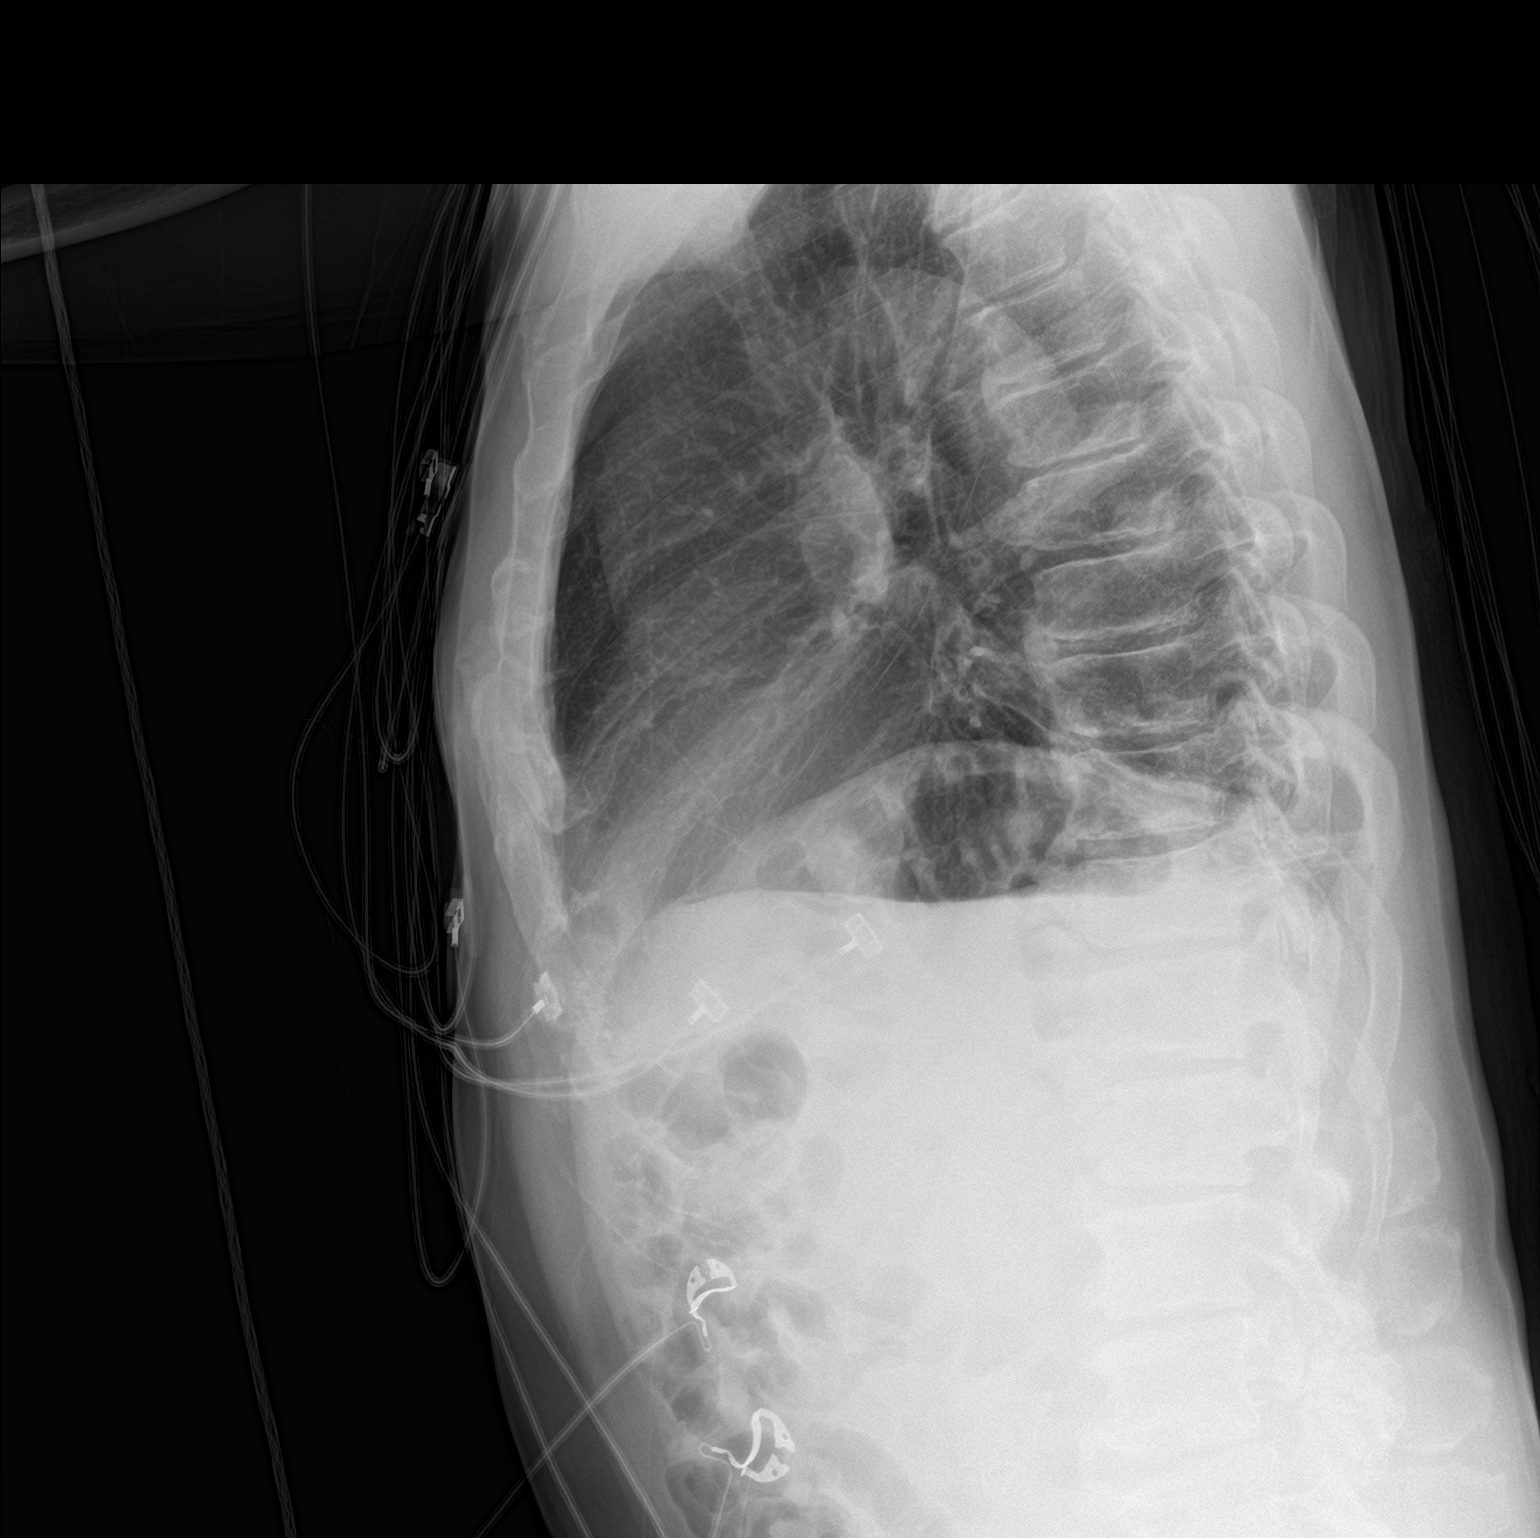

[2 of 2 positions shown; findings below may reference images not displayed]

FINDINGS: Cardiac silhouette is normal in size. No mediastinal or hilar
masses. No evidence of adenopathy.

Chronic bronchitic changes in the lower lobes. Lungs are otherwise
clear. No pleural effusion or pneumothorax.

Skeletal structures are intact.
IMPRESSION: No active cardiopulmonary disease.

## 2020-03-22 IMAGING — US US EXTREM LOW VENOUS*L*
1 series · 13 of 24 positions shown · non-contrast
Comparison: 12/29/2016

CLINICAL DATA: 62-year-old with left thigh edema.



[Series 1: us extrem low venous*left* · 13 of 37 slices shown]
[im 1/37]
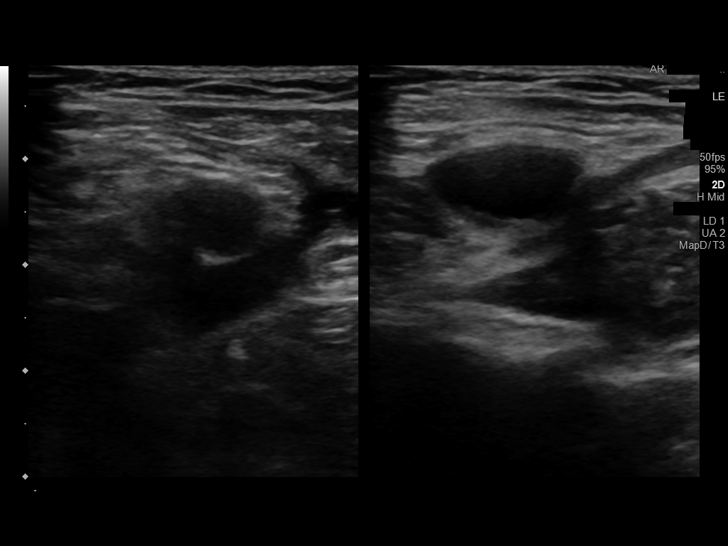
[im 4/37]
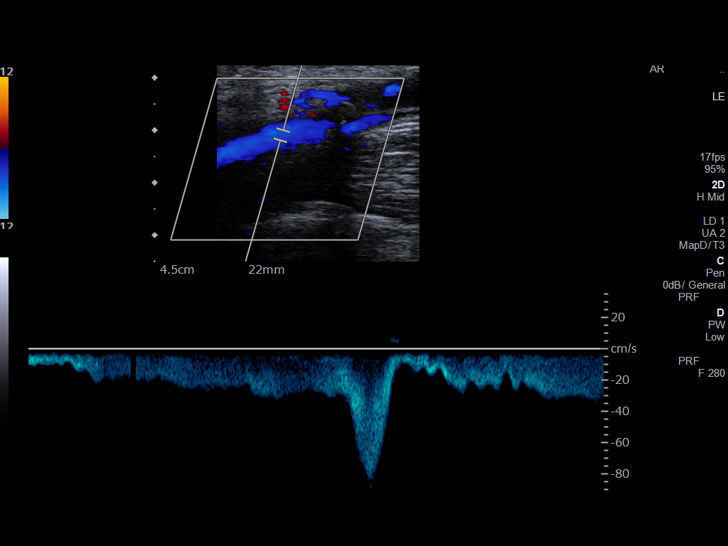
[im 7/37]
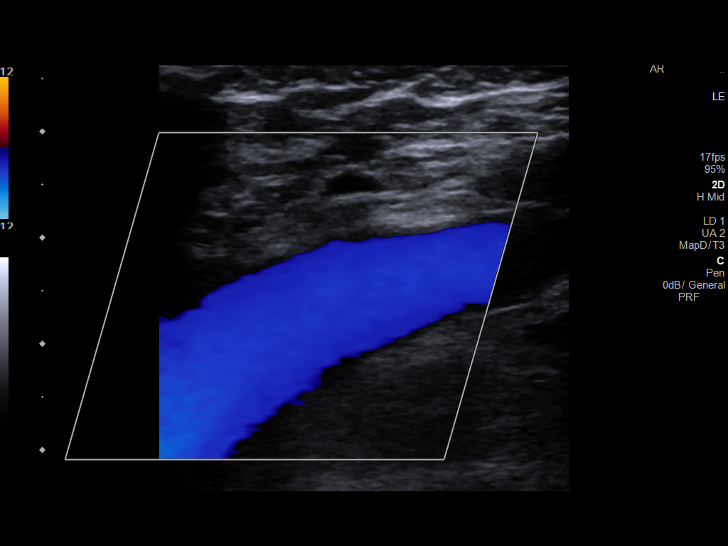
[im 10/37]
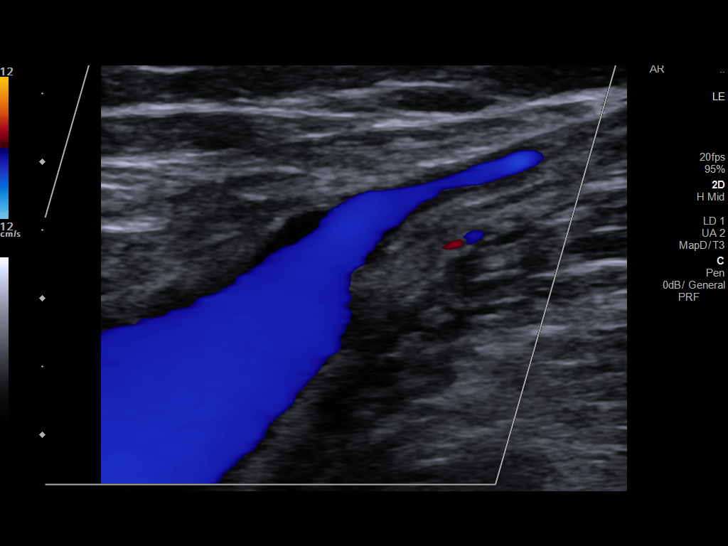
[im 13/37]
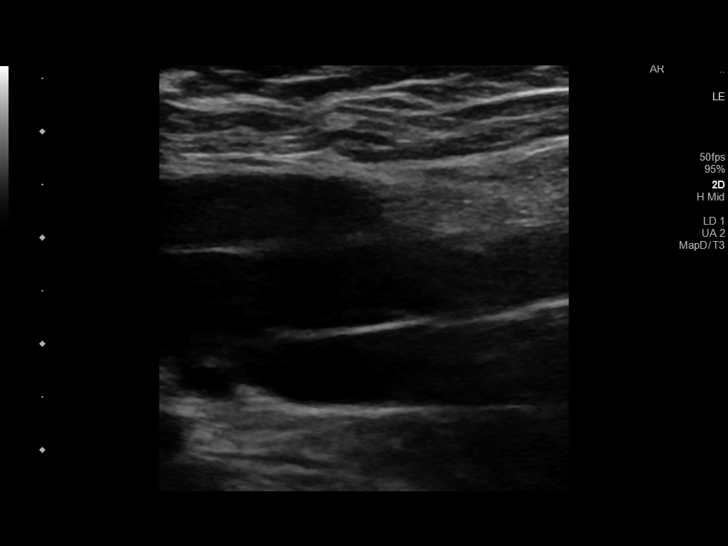
[im 16/37]
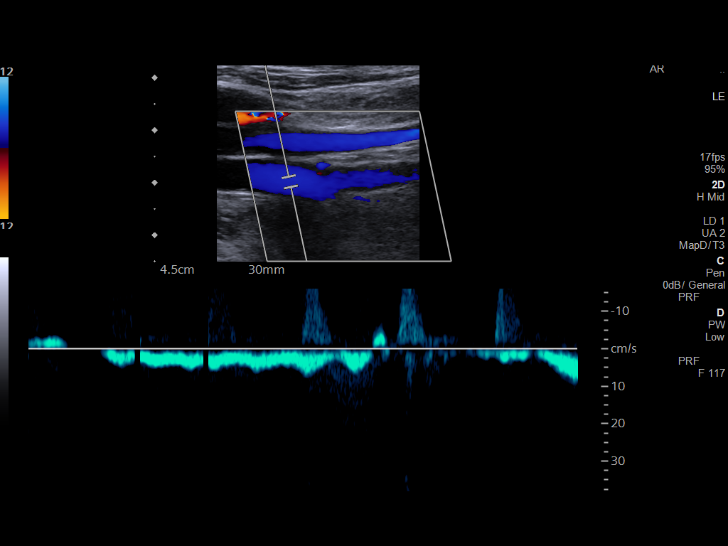
[im 19/37]
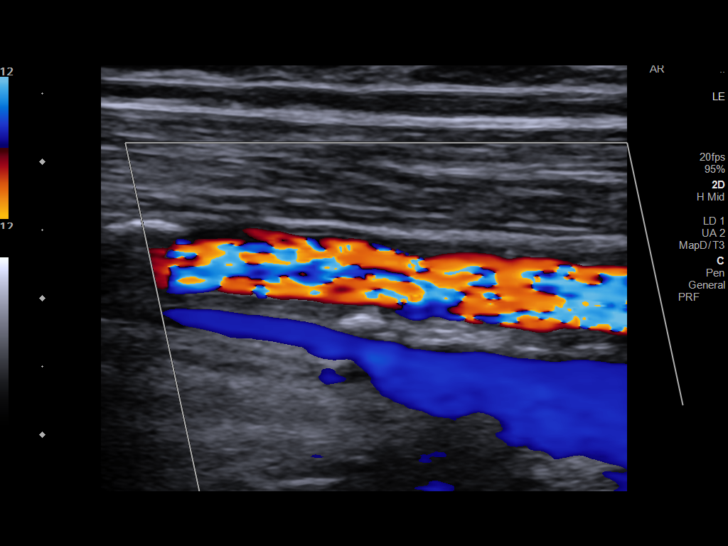
[im 21/37]
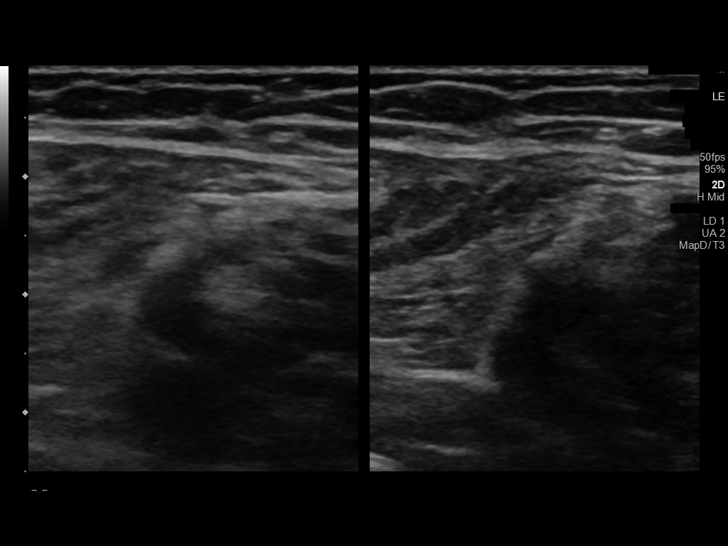
[im 24/37]
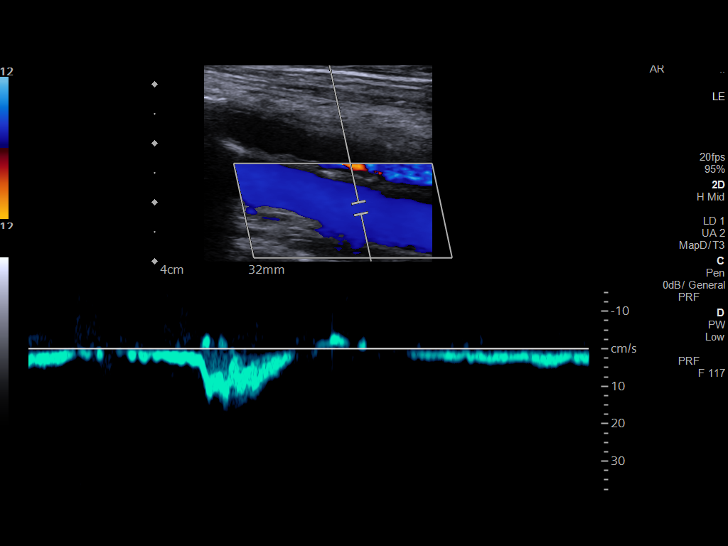
[im 27/37]
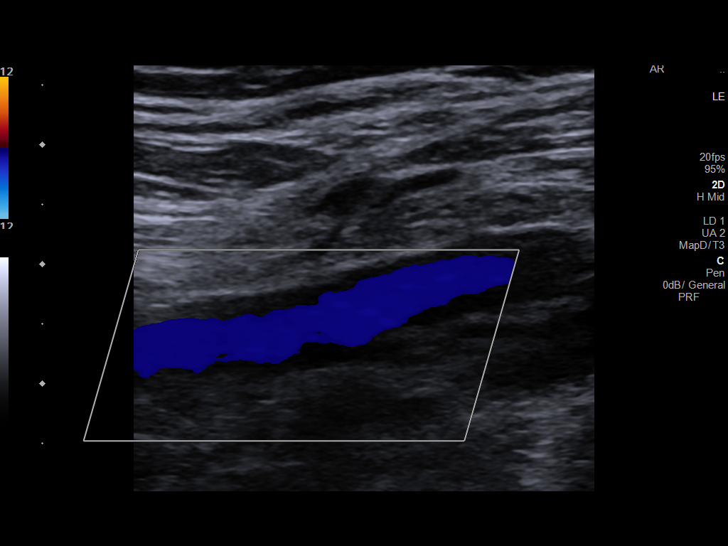
[im 30/37]
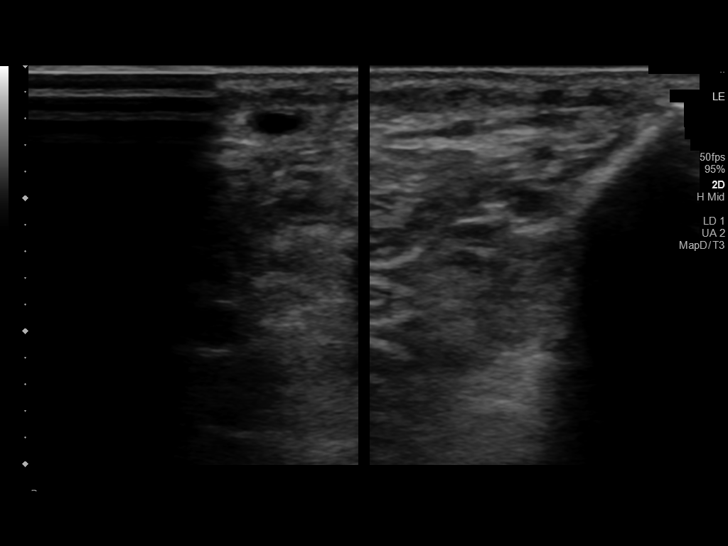
[im 33/37]
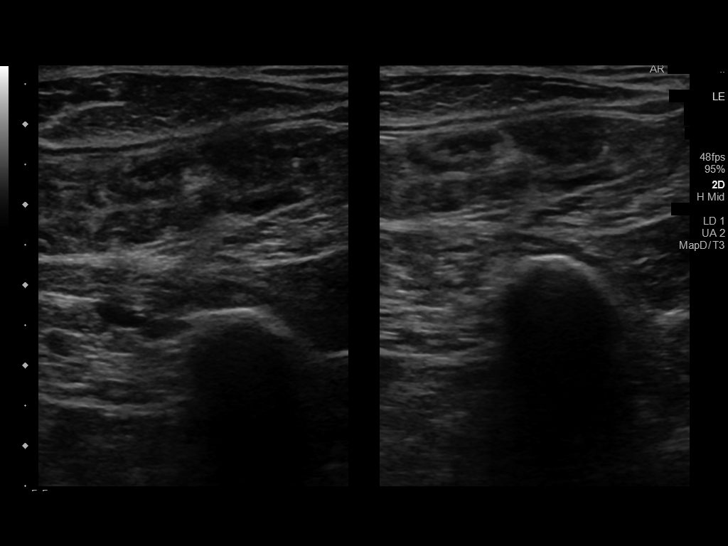
[im 37/37]
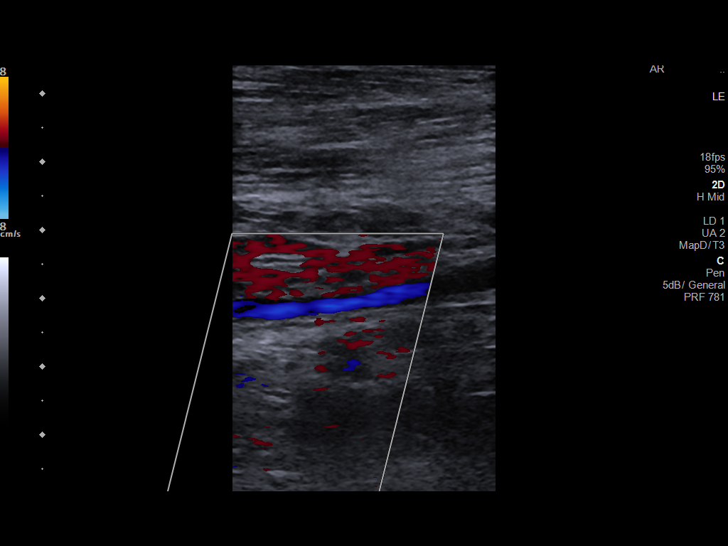

[13 of 24 positions shown; findings below may reference images not displayed]

FINDINGS: Contralateral Common Femoral Vein: Respiratory phasicity is normal
and symmetric with the symptomatic side. No evidence of thrombus.
Normal compressibility.

Common Femoral Vein: No evidence of thrombus. Normal
compressibility, respiratory phasicity and response to augmentation.

Saphenofemoral Junction: No evidence of thrombus. Normal
compressibility and flow on color Doppler imaging.

Profunda Femoral Vein: No evidence of thrombus. Normal
compressibility and flow on color Doppler imaging.

Femoral Vein: No evidence of thrombus. Normal compressibility,
respiratory phasicity and response to augmentation.

Popliteal Vein: No evidence of thrombus. Normal compressibility,
respiratory phasicity and response to augmentation.

Calf Veins: No evidence of thrombus. Normal compressibility and flow
on color Doppler imaging.

Superficial Great Saphenous Vein: No evidence of thrombus. Normal
compressibility.

Other Findings:  None.
IMPRESSION: Negative for deep venous thrombosis in left lower extremity.

## 2020-03-22 IMAGING — DX DG KNEE COMPLETE 4+V*L*
4 series · 4 of 4 positions shown · non-contrast
Comparison: None.

CLINICAL DATA: Headache with left knee and ankle swelling x 4 days

EXAM:
LEFT KNEE - COMPLETE 4+ VIEW

[knee ap]
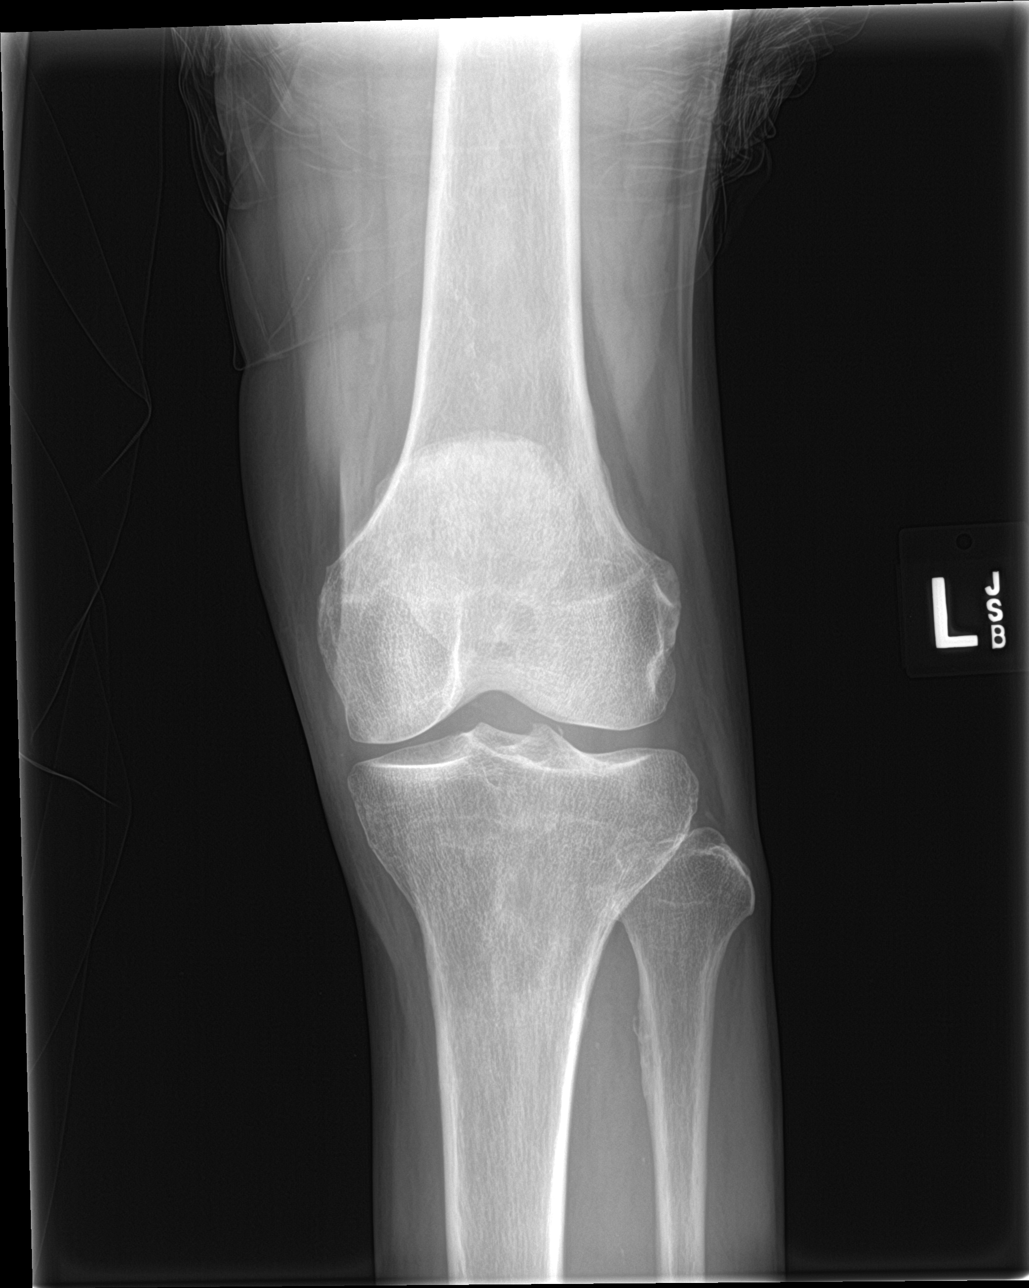

[tunnel]
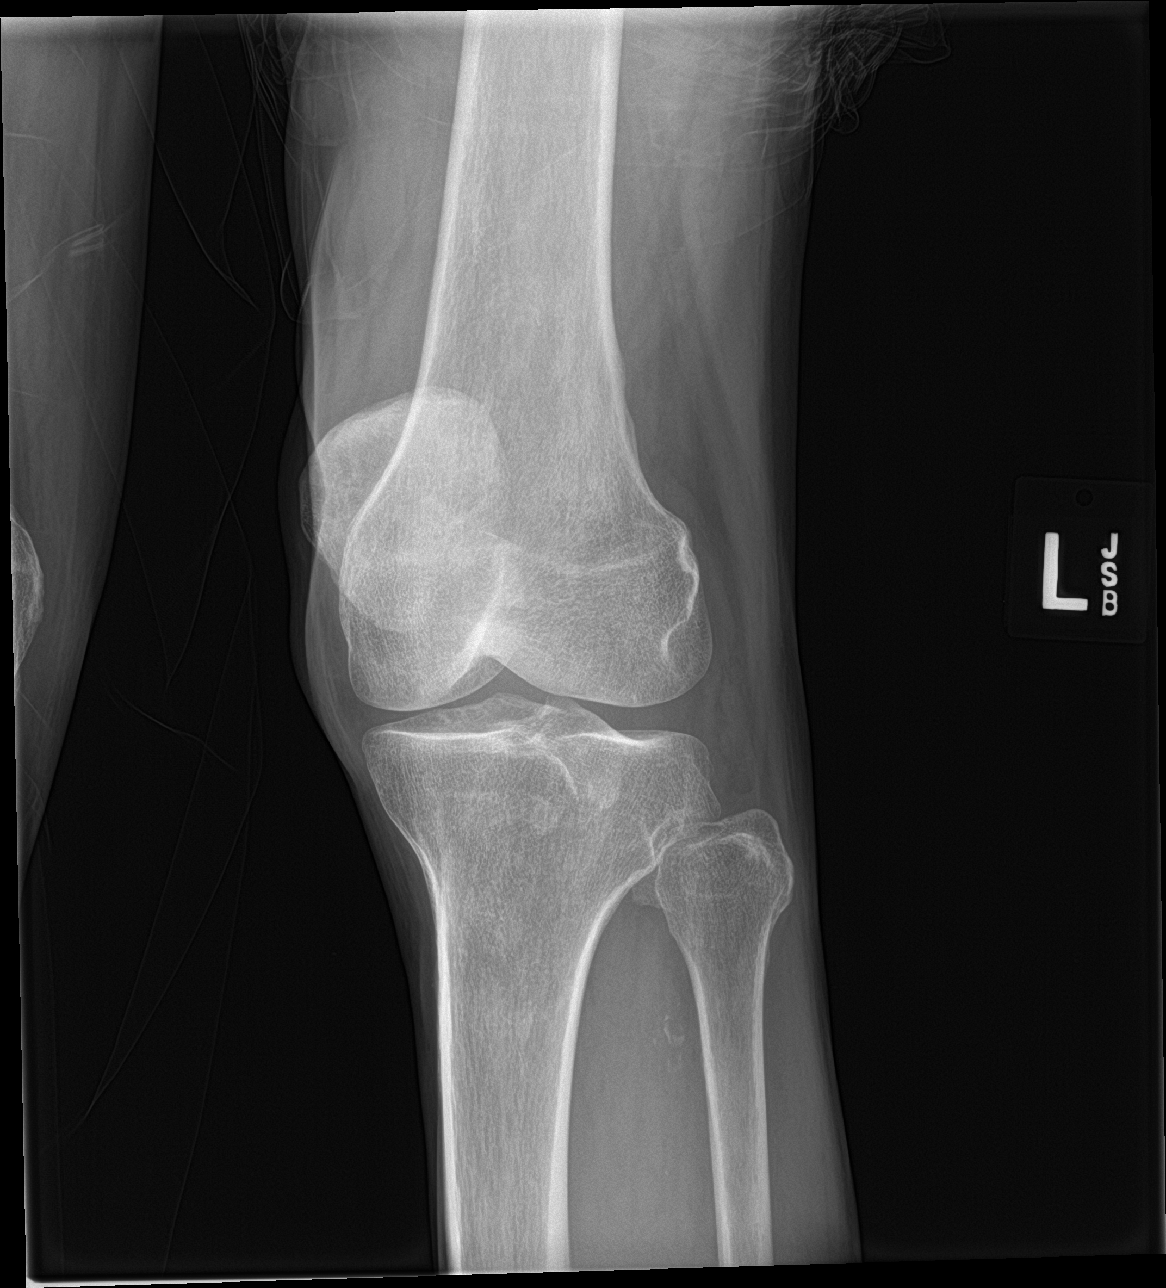

[knee lat]
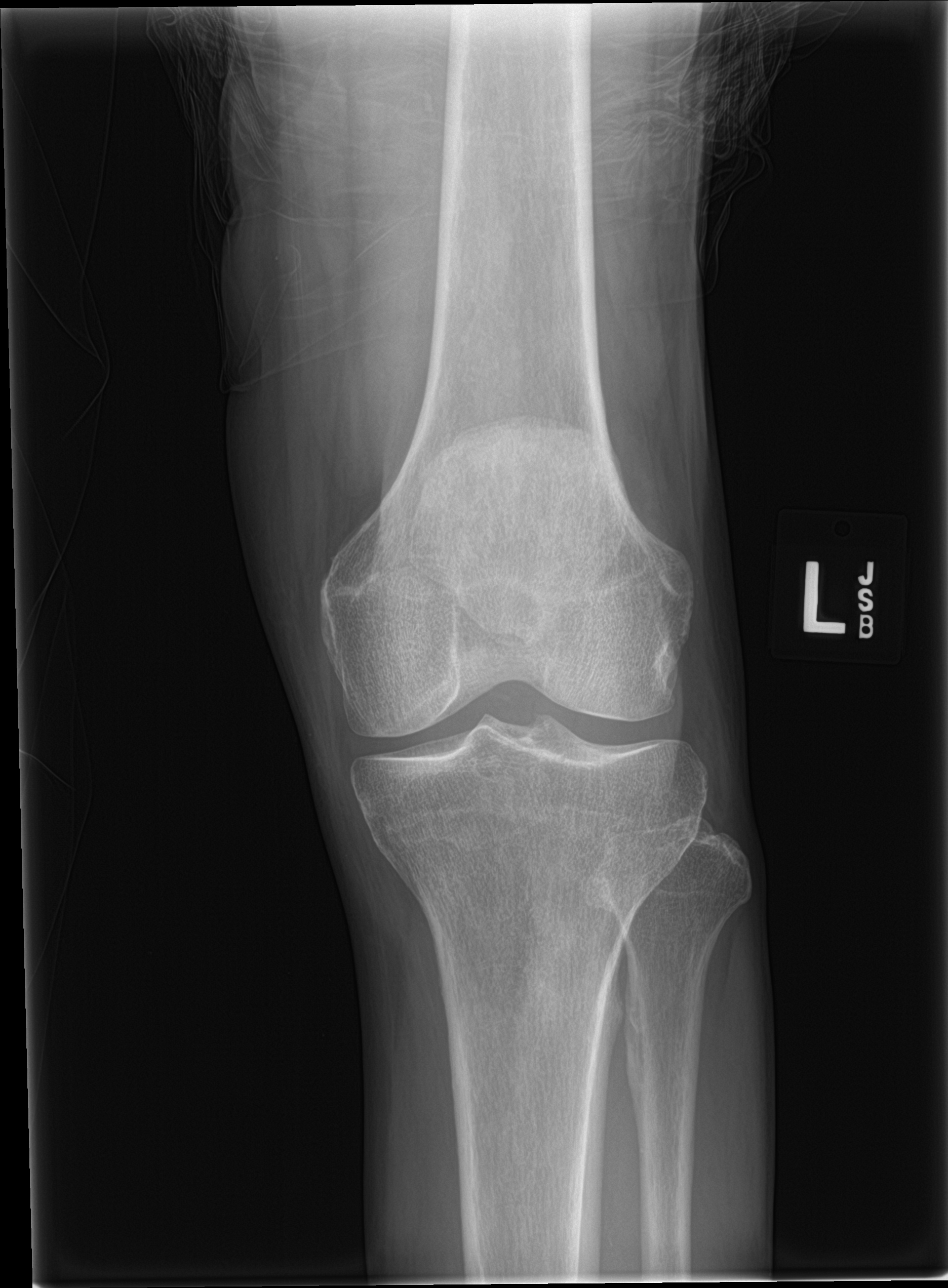

[knee sunrise]
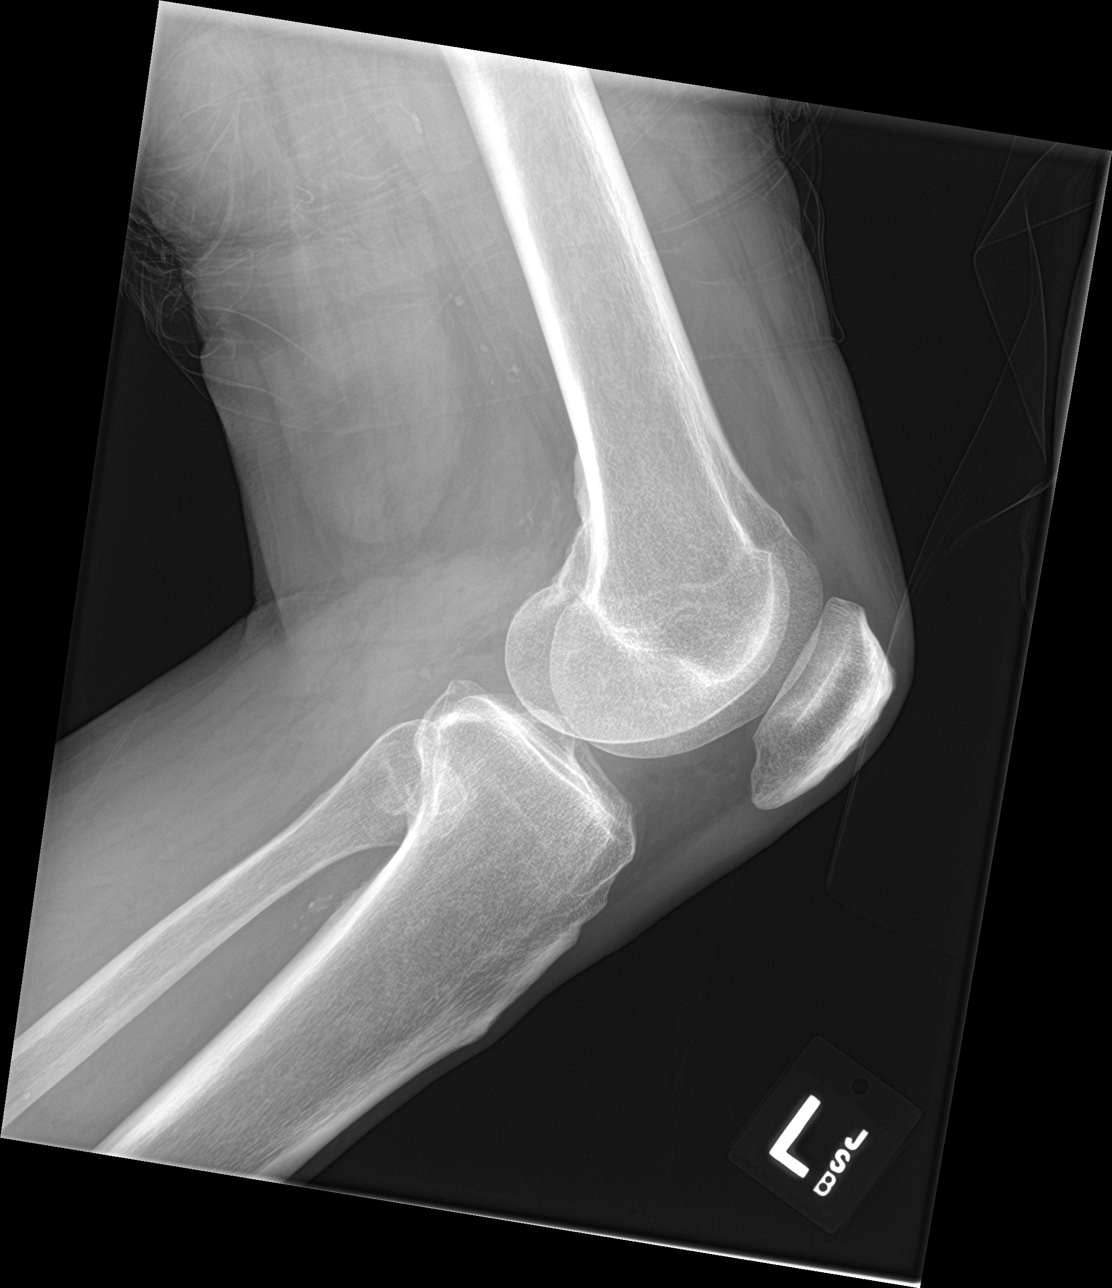

[4 of 4 positions shown; findings below may reference images not displayed]

FINDINGS: No fracture or bone lesion.

Knee joint normally spaced and aligned. No arthropathic changes. No
joint effusion. There are scattered arterial vascular calcifications
posteriorly. Soft tissues are otherwise unremarkable.
IMPRESSION: No fracture, bone lesion or joint abnormality.
